# Patient Record
Sex: Female | Born: 1995 | Race: Black or African American | Hispanic: No | Marital: Single | Smoking: Never smoker
Health system: Southern US, Community
[De-identification: ages and names within clinical notes are randomized; demographics above are authoritative.]

## PROBLEM LIST (undated history)

## (undated) DIAGNOSIS — F329 Major depressive disorder, single episode, unspecified: Secondary | ICD-10-CM

## (undated) DIAGNOSIS — E039 Hypothyroidism, unspecified: Secondary | ICD-10-CM

## (undated) DIAGNOSIS — R002 Palpitations: Secondary | ICD-10-CM

## (undated) DIAGNOSIS — H409 Unspecified glaucoma: Secondary | ICD-10-CM

## (undated) DIAGNOSIS — F419 Anxiety disorder, unspecified: Secondary | ICD-10-CM

## (undated) DIAGNOSIS — J45909 Unspecified asthma, uncomplicated: Secondary | ICD-10-CM

## (undated) DIAGNOSIS — F32A Depression, unspecified: Secondary | ICD-10-CM

## (undated) DIAGNOSIS — R0789 Other chest pain: Principal | ICD-10-CM

## (undated) DIAGNOSIS — K219 Gastro-esophageal reflux disease without esophagitis: Secondary | ICD-10-CM

## (undated) DIAGNOSIS — E079 Disorder of thyroid, unspecified: Secondary | ICD-10-CM

## (undated) HISTORY — DX: Palpitations: R00.2

## (undated) HISTORY — DX: Other chest pain: R07.89

## (undated) HISTORY — PX: WISDOM TOOTH EXTRACTION: SHX21

## (undated) HISTORY — PX: EYE SURGERY: SHX253

---
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
## 2016-01-13 ENCOUNTER — Other Ambulatory Visit (HOSPITAL_COMMUNITY)
Admission: RE | Admit: 2016-01-13 | Discharge: 2016-01-13 | Disposition: A | Discharge status: Home / Self Care | Payer: BLUE CROSS/BLUE SHIELD | Source: Ambulatory Visit | Attending: Family Medicine | Admitting: Family Medicine

## 2016-01-13 ENCOUNTER — Encounter (HOSPITAL_COMMUNITY): Payer: Self-pay | Admitting: Emergency Medicine

## 2016-01-13 ENCOUNTER — Emergency Department (INDEPENDENT_AMBULATORY_CARE_PROVIDER_SITE_OTHER)
Admission: EM | Admit: 2016-01-13 | Discharge: 2016-01-13 | Disposition: A | Discharge status: Home / Self Care | Source: Home / Self Care | Attending: Family Medicine | Admitting: Family Medicine

## 2016-01-13 DIAGNOSIS — N949 Unspecified condition associated with female genital organs and menstrual cycle: Secondary | ICD-10-CM

## 2016-01-13 DIAGNOSIS — Z113 Encounter for screening for infections with a predominantly sexual mode of transmission: Secondary | ICD-10-CM | POA: Diagnosis present

## 2016-01-13 DIAGNOSIS — N760 Acute vaginitis: Secondary | ICD-10-CM | POA: Insufficient documentation

## 2016-01-13 DIAGNOSIS — R1032 Left lower quadrant pain: Secondary | ICD-10-CM | POA: Diagnosis not present

## 2016-01-13 HISTORY — DX: Unspecified glaucoma: H40.9

## 2016-01-13 LAB — POCT URINALYSIS DIP (DEVICE)
Glucose, UA: NEGATIVE mg/dL
Hgb urine dipstick: NEGATIVE
LEUKOCYTES UA: NEGATIVE
NITRITE: NEGATIVE
PROTEIN: 30 mg/dL — AB
SPECIFIC GRAVITY, URINE: 1.025 (ref 1.005–1.030)
UROBILINOGEN UA: 2 mg/dL — AB (ref 0.0–1.0)
pH: 6.5 (ref 5.0–8.0)

## 2016-01-13 LAB — URINALYSIS, ROUTINE W REFLEX MICROSCOPIC
Glucose, UA: NEGATIVE mg/dL
Hgb urine dipstick: NEGATIVE
Ketones, ur: 15 mg/dL — AB
Leukocytes, UA: NEGATIVE
NITRITE: NEGATIVE
PH: 6 (ref 5.0–8.0)
PROTEIN: NEGATIVE mg/dL
Specific Gravity, Urine: 1.029 (ref 1.005–1.030)

## 2016-01-13 LAB — URINE MICROSCOPIC-ADD ON: RBC / HPF: NONE SEEN RBC/hpf (ref 0–5)

## 2016-01-13 LAB — POCT PREGNANCY, URINE: Preg Test, Ur: NEGATIVE

## 2016-01-13 MED ORDER — CEFTRIAXONE SODIUM 250 MG IJ SOLR
250.0000 mg | Freq: Once | INTRAMUSCULAR | Status: AC
  Administered 2016-01-13: 250 mg via INTRAMUSCULAR

## 2016-01-13 MED ORDER — CEFTRIAXONE SODIUM 250 MG IJ SOLR
INTRAMUSCULAR | Status: AC
  Filled 2016-01-13: qty 250

## 2016-01-13 MED ORDER — LIDOCAINE HCL (PF) 1 % IJ SOLN
INTRAMUSCULAR | Status: AC
  Filled 2016-01-13: qty 5

## 2016-01-13 MED ORDER — NAPROXEN 500 MG PO TABS: 500.0000 mg | ORAL_TABLET | Freq: Two times a day (BID) | ORAL | Status: DC

## 2016-01-13 MED ORDER — AZITHROMYCIN 250 MG PO TABS
ORAL_TABLET | ORAL | Status: AC
  Filled 2016-01-13: qty 4

## 2016-01-13 MED ORDER — AZITHROMYCIN 250 MG PO TABS
1000.0000 mg | ORAL_TABLET | Freq: Once | ORAL | Status: AC
  Administered 2016-01-13: 1000 mg via ORAL

## 2016-01-13 NOTE — ED Notes (Signed)
Complains of a week of symptoms: dizziness, left lower abdominal pain and chest pain.  [facility]  Patient says she was tested for std and urine tests and told all were negative.  Patient here today because she is still having symptoms.  Last bm was this morning and made no change in degree of symptoms.  Left lower abdominal pain is constant and dull.

## 2016-01-13 NOTE — ED Provider Notes (Addendum)
CSN: [id number]     Arrival date & time 01/13/16  1257 History   First MD Initiated Contact with Patient 01/13/16 1313     Chief Complaint  Patient presents with  . Abdominal Pain  . Dizziness   (Consider location/radiation/quality/duration/timing/severity/associated sxs/prior Treatment) Patient is a 20 y.o. female presenting with abdominal pain and dizziness. The history is provided by the patient. No language interpreter was used.  Abdominal Pain Associated symptoms: chest pain, diarrhea, dysuria and nausea   Associated symptoms: no chills, no cough, no fatigue, no fever, no shortness of breath, no vaginal discharge and no vomiting   Dizziness Associated symptoms: chest pain, diarrhea and nausea   Associated symptoms: no blood in stool, no shortness of breath and no vomiting   Patient presents with one week of dull LLQ abd pain, associated with decreased appetite and watery non-bloody stool.  Came on a week ago; mildly improved now from earlier. Last BM this morning, was watery.  Also BM yesterday, watery. No dietary changes, no travel.  Has had this before, usually resolves in 2-3 days. No fevers or chills, no vomiting.    She reports some reflux-like chest pain; some mild dysuria without urgency or frequency.  History cystitis in Summer 2016.  No abdominal surgeries.  LMP March 19th, 2017 and usual timing. Last sexual intercourse was 4 months ago. No STI history.  [facility], says she was tested for STIs and called last night to notify that all the testing was negative.   Past Medical History  Diagnosis Date  . Asthma   . Thyroid disease    No past surgical history on file. No family history on file. Social History  Substance Use Topics  . Smoking status: Never Smoker   . Smokeless tobacco: Not on file  . Alcohol Use: No   OB History    No data available     Review of Systems  Constitutional: Positive for appetite change.  Negative for fever, chills, diaphoresis and fatigue.  Respiratory: Negative for cough and shortness of breath.   Cardiovascular: Positive for chest pain.       Chest pain like reflux in nature  Gastrointestinal: Positive for nausea, abdominal pain and diarrhea. Negative for vomiting, blood in stool, abdominal distention and anal bleeding.  Genitourinary: Positive for dysuria. Negative for decreased urine volume and vaginal discharge.  Neurological: Positive for dizziness.    Allergies  Review of patient's allergies indicates no known allergies.  Home Medications   Prior to Admission medications   Medication Sig Start Date End Date Taking? Authorizing Provider  albuterol (PROVENTIL HFA;VENTOLIN HFA) 108 (90 BASE) MCG/ACT inhaler Inhale 1-2 puffs into the lungs every 6 (six) hours as needed for wheezing or shortness of breath.    Historical Provider, MD  albuterol (PROVENTIL HFA;VENTOLIN HFA) 108 (90 BASE) MCG/ACT inhaler Inhale 1-2 puffs into the lungs every 6 (six) hours as needed for wheezing or shortness of breath. 05/17/15   Elwin MochaBlair Walden, MD  ALPRAZolam Prudy Feeler(XANAX) 0.5 MG tablet Take 1 tablet (0.5 mg total) by mouth 2 (two) times daily as needed for anxiety. 05/17/15   Elwin MochaBlair Walden, MD  bimatoprost (LUMIGAN) 0.03 % ophthalmic solution Place 1 drop into the right eye at bedtime.    Historical Provider, MD  brimonidine (ALPHAGAN) 0.2 % ophthalmic solution Place 1 drop into the right eye 2 (two) times daily.    Historical Provider, MD  dorzolamide-timolol (COSOPT) 22.3-6.8 MG/ML ophthalmic solution Place  1 drop into the right eye 2 (two) times daily.    Historical Provider, MD  levothyroxine (SYNTHROID, LEVOTHROID) 100 MCG tablet Take 100 mcg by mouth daily before breakfast.    Historical Provider, MD  norgestimate-ethinyl estradiol (ORTHO-CYCLEN,SPRINTEC,PREVIFEM) 0.25-35 MG-MCG tablet Take 1 tablet by mouth daily.    Historical Provider, MD   Meds Ordered and Administered this Visit   Medications - No data to display  BP 97/71 mmHg  Pulse 69  Temp(Src) 98 F (36.7 C) (Oral)  Resp 16  SpO2 95% No data found.   Physical Exam  Constitutional: She appears well-developed and well-nourished. No distress.  Neck: Normal range of motion. Neck supple.  Cardiovascular: Normal rate, regular rhythm and normal heart sounds.   Pulmonary/Chest: Effort normal and breath sounds normal. No respiratory distress. She has no wheezes. She has no rales. She exhibits no tenderness.  Abdominal: She exhibits no distension and no mass. There is no tenderness. There is no rebound and no guarding.  LLQ tenderness; no masses or guarding. Mild suprapubic tenderness. No CVA tenderness. Audible bowel sounds.   Genitourinary: Uterus normal. Vaginal discharge found.  Normal vaginal mucosa.  Scant white vaginal discharge noted.  Bimanual exam with some L adnexal tenderness and CMT.  No enlarged adnexae.  Lymphadenopathy:    She has no cervical adenopathy.  Skin: She is not diaphoretic.    ED Course  Procedures (including critical care time)  Labs Review Labs Reviewed  POCT URINALYSIS DIP (DEVICE) - Abnormal; Notable for the following:    Bilirubin Urine SMALL (*)    Ketones, ur TRACE (*)    Protein, ur 30 (*)    Urobilinogen, UA 2.0 (*)    All other components within normal limits  URINALYSIS, ROUTINE W REFLEX MICROSCOPIC (NOT AT [facility])  CERVICOVAGINAL ANCILLARY ONLY    Imaging Review No results found.   Visual Acuity Review  Right Eye Distance:   Left Eye Distance:   Bilateral Distance:    Right Eye Near:   Left Eye Near:    Bilateral Near:         MDM   1. LLQ abdominal pain    LLQ abdominal pain, onset 1 week ago and mildly better.  She has a primary doctor at her home in [place], learning disability here in [place]). Some CMT on bimanual exam, as well as L adnexal tenderness.  UA dipstick does not support cystitis as diagnosis.  To treat with azithro 1g po x1, ceftriaxone   IM x1; await cervical cx and counseled on rationale for treatment.   Naproxen  po q12 hrs as needed for pain.     Barbaraann Barthel, MD 01/13/16 1328  Barbaraann Barthel, MD 01/13/16 1340

## 2016-01-13 NOTE — Discharge Instructions (Signed)
It is a pleasure to see you today.   We collected urine and cervical specimens for culture and microscopy, to look for various infections that can cause discharge and abdominal pain, and decreased appetite.   As we discussed, you are receiving treatment in the urgent care center for a possible vaginal infection.  You are receiving CEFTRIAXONE 250mg  by intramuscular injection; AZITHROMYCIN 1gram by mouth x1.  You will be contacted when the results of these studies comes back.    For now, you are advised not to have sexual intercourse until the results of the studies are back and your complaint is resolved.   You may take NAPROXEN 500mg  tablets, 1 tablet by mouth twice daily with something to eat, for the pain.   Return to the urgent care center or your primary doctor if you experience worsening abdominal pain, fevers/chills, repeated vomiting, or other concerns.

## 2016-01-14 LAB — CERVICOVAGINAL ANCILLARY ONLY
Chlamydia: NEGATIVE
NEISSERIA GONORRHEA: NEGATIVE
WET PREP (BD AFFIRM): POSITIVE — AB

## 2016-01-15 ENCOUNTER — Telehealth: Payer: Self-pay | Admitting: Internal Medicine

## 2016-01-15 DIAGNOSIS — B9689 Other specified bacterial agents as the cause of diseases classified elsewhere: Secondary | ICD-10-CM

## 2016-01-15 DIAGNOSIS — N760 Acute vaginitis: Principal | ICD-10-CM

## 2016-01-15 LAB — URINE CULTURE

## 2016-01-15 MED ORDER — METRONIDAZOLE 500 MG PO TABS
500.0000 mg | ORAL_TABLET | Freq: Two times a day (BID) | ORAL | Status: DC
Start: 2016-01-15 — End: 2016-08-13

## 2016-01-15 NOTE — ED Notes (Signed)
Test for gardnerella (bacterial vaginosis) positive; tests for gonorrhea/chlamydia negative.  Urine culture pending.  Will send rx for metronidazole to pharmacy of record ([facility]).  Recheck or followup pcp in [place] for persistent dysuria/vag discharge/abd discomfort.  LM  Eustace MooreLaura W Spencer Peterkin, MD 01/15/16 [phone]

## 2016-01-22 ENCOUNTER — Telehealth (HOSPITAL_COMMUNITY): Payer: Self-pay | Admitting: Emergency Medicine

## 2016-01-22 NOTE — ED Notes (Signed)
Called pt and notified of recent lab results from visit 3/28 Pt ID'd properly... Reports feeling better and sx have subsided; still taking Flagyl  Per Dr. Dayton ScrapeMurray,  Clinical staff, please let patient know that test for gardnerella (bacterial vaginosis) was positive; tests for gonorrhea/chlamydia negative. Urine culture pending.  Will send rx for metronidazole to pharmacy of record ([facility]). Recheck or followup pcp in [place] for persistent dysuria/vag discharge/abd discomfort. LM  Adv pt if sx are not getting better to return  Pt verb understanding Education on safe sex given

## 2016-08-13 ENCOUNTER — Ambulatory Visit (HOSPITAL_COMMUNITY)
Admission: EM | Admit: 2016-08-13 | Discharge: 2016-08-13 | Disposition: A | Discharge status: Home / Self Care | Payer: BLUE CROSS/BLUE SHIELD | Attending: Internal Medicine | Admitting: Internal Medicine

## 2016-08-13 ENCOUNTER — Ambulatory Visit (INDEPENDENT_AMBULATORY_CARE_PROVIDER_SITE_OTHER): Payer: BLUE CROSS/BLUE SHIELD

## 2016-08-13 ENCOUNTER — Encounter (HOSPITAL_COMMUNITY): Payer: Self-pay | Admitting: Family Medicine

## 2016-08-13 DIAGNOSIS — R634 Abnormal weight loss: Secondary | ICD-10-CM

## 2016-08-13 DIAGNOSIS — N939 Abnormal uterine and vaginal bleeding, unspecified: Secondary | ICD-10-CM

## 2016-08-13 NOTE — ED Provider Notes (Signed)
CSN: [id number]     Arrival date & time 08/13/16  1123 History   First MD Initiated Contact with Patient 08/13/16 1232     Chief Complaint  Patient presents with  . Constipation  . Vaginal Bleeding  . Weight Loss   (Consider location/radiation/quality/duration/timing/severity/associated sxs/prior Treatment) HPI NP 20 Y/O FEMALE [employer]. WEIGHT LOSS SINCE SCHOOL STARTED 25 #. ALSO WITH BREAKTHROUGH VAGINAL BLEEDING. AND NOW CONSTIPATION. STATES SHE IS BEING TREATED FOR DEPRESSION WITH MEDS AND COUNSELOR. SHE DOES HAVE CONSTIPATION, NO BM SINCE Saturday STARTED LEXAPRO RECENTLY AND IS FEELING MUCH BETTER.  Past Medical History:  Diagnosis Date  . Asthma   . Glaucoma   . Thyroid disease    Past Surgical History:  Procedure Laterality Date  . EYE SURGERY     History reviewed. No pertinent family history. Social History  Substance Use Topics  . Smoking status: Never Smoker  . Smokeless tobacco: Never Used  . Alcohol use No   OB History    No data available     Review of Systems  Denies: HEADACHE, NAUSEA, ABDOMINAL PAIN, CHEST PAIN, CONGESTION, DYSURIA, SHORTNESS OF BREATH  Allergies  Review of patient's allergies indicates no known allergies.  Home Medications   Prior to Admission medications   Medication Sig Start Date End Date Taking? Authorizing Provider  escitalopram (LEXAPRO) 10 MG tablet Take 10 mg by mouth daily.   Yes Historical Provider, MD  albuterol (PROVENTIL HFA;VENTOLIN HFA) 108 (90 BASE) MCG/ACT inhaler Inhale 1-2 puffs into the lungs every 6 (six) hours as needed for wheezing or shortness of breath.    Historical Provider, MD  albuterol (PROVENTIL HFA;VENTOLIN HFA) 108 (90 BASE) MCG/ACT inhaler Inhale 1-2 puffs into the lungs every 6 (six) hours as needed for wheezing or shortness of breath. 05/17/15   Elwin Mocha, MD  levothyroxine (SYNTHROID, LEVOTHROID) 100 MCG tablet Take 100 mcg by mouth daily before breakfast.    Historical  Provider, MD   Meds Ordered and Administered this Visit  Medications - No data to display  BP 131/68   Pulse 67   Temp 98.2 F (36.8 C)   Resp 18   LMP 08/09/2016   SpO2 100%  No data found.   Physical Exam NURSES NOTES AND VITAL SIGNS REVIEWED. CONSTITUTIONAL: Well developed, well nourished, no acute distress HEENT: normocephalic, atraumatic EYES: Conjunctiva normal NECK:normal ROM, supple, no adenopathy PULMONARY:No respiratory distress, normal effort ABDOMINAL: Soft, ND, NT BS+, No CVAT MUSCULOSKELETAL: Normal ROM of all extremities,  SKIN: warm and dry without rash PSYCHIATRIC: Mood and affect, behavior are normal  Urgent Care Course   Clinical Course    Procedures (including critical care time)  Labs Review Labs Reviewed - No data to display  Imaging Review Dg Abd 1 View  Result Date: 08/13/2016 CLINICAL DATA:  Constipation. EXAM: ABDOMEN - 1 VIEW COMPARISON:  None. FINDINGS: The bowel gas pattern is normal. No radio-opaque calculi or other significant radiographic abnormality are seen. IMPRESSION: Negative. Electronically Signed   By: Elsie Stain M.D.   On: 08/13/2016 13:16     Visual Acuity Review  Right Eye Distance:   Left Eye Distance:   Bilateral Distance:    Right Eye Near:   Left Eye Near:    Bilateral Near:         MDM   1. Vaginal bleeding   2. Weight loss     Patient is reassured that there are no issues that require transfer to higher level  of care at this time or additional tests. Patient is advised to continue home symptomatic treatment. Patient is advised that if there are new or worsening symptoms to attend the emergency department, contact primary care provider, or return to UC. Instructions of care provided discharged home in stable condition.    THIS NOTE WAS GENERATED USING A VOICE RECOGNITION SOFTWARE PROGRAM. ALL REASONABLE EFFORTS  WERE MADE TO PROOFREAD THIS DOCUMENT FOR ACCURACY.  I have verbally reviewed the  discharge instructions with the patient. A printed AVS was given to the patient.  All questions were answered prior to discharge.      Tharon AquasFrank C Devina Bezold, PA 08/13/16 1334

## 2016-08-13 NOTE — Discharge Instructions (Signed)
SOMETIMES DEPRESSION CAN CAUSE DECREASED INTEREST IN FOODS, THAT CAN CAUSE WEIGHT LOSS  YOUR XRAY APPEARS NORMAL  YOU SHOULD FOLLOW UP WITH GYN ABOUT YOUR BREAKTHROUGH BLEEDING.

## 2016-08-13 NOTE — ED Triage Notes (Signed)
Pt here for weight loss x 3 weeks, left pelvic pain, constipation and abnormal vaginal bleeding. sts that she has the implant her arm and typically has normal periods. sts LBM last Saturday.

## 2016-08-14 ENCOUNTER — Emergency Department (HOSPITAL_COMMUNITY)
Admission: EM | Admit: 2016-08-14 | Discharge: 2016-08-14 | Disposition: A | Discharge status: Home / Self Care | Payer: BLUE CROSS/BLUE SHIELD | Attending: Emergency Medicine | Admitting: Emergency Medicine

## 2016-08-14 ENCOUNTER — Encounter (HOSPITAL_COMMUNITY): Payer: Self-pay | Admitting: *Deleted

## 2016-08-14 ENCOUNTER — Emergency Department (HOSPITAL_COMMUNITY): Payer: BLUE CROSS/BLUE SHIELD

## 2016-08-14 DIAGNOSIS — R1030 Lower abdominal pain, unspecified: Secondary | ICD-10-CM | POA: Insufficient documentation

## 2016-08-14 DIAGNOSIS — Z91010 Allergy to peanuts: Secondary | ICD-10-CM | POA: Insufficient documentation

## 2016-08-14 DIAGNOSIS — R0789 Other chest pain: Secondary | ICD-10-CM

## 2016-08-14 DIAGNOSIS — J45909 Unspecified asthma, uncomplicated: Secondary | ICD-10-CM | POA: Diagnosis not present

## 2016-08-14 DIAGNOSIS — R0602 Shortness of breath: Secondary | ICD-10-CM | POA: Diagnosis present

## 2016-08-14 HISTORY — DX: Anxiety disorder, unspecified: F41.9

## 2016-08-14 HISTORY — DX: Depression, unspecified: F32.A

## 2016-08-14 HISTORY — DX: Major depressive disorder, single episode, unspecified: F32.9

## 2016-08-14 LAB — URINALYSIS, ROUTINE W REFLEX MICROSCOPIC
Bilirubin Urine: NEGATIVE
GLUCOSE, UA: NEGATIVE mg/dL
KETONES UR: 40 mg/dL — AB
NITRITE: NEGATIVE
PROTEIN: NEGATIVE mg/dL
Specific Gravity, Urine: 1.02 (ref 1.005–1.030)
pH: 7.5 (ref 5.0–8.0)

## 2016-08-14 LAB — CBC
HEMATOCRIT: 41.2 % (ref 36.0–46.0)
HEMOGLOBIN: 14.4 g/dL (ref 12.0–15.0)
MCH: 31.4 pg (ref 26.0–34.0)
MCHC: 35 g/dL (ref 30.0–36.0)
MCV: 89.8 fL (ref 78.0–100.0)
Platelets: 218 10*3/uL (ref 150–400)
RBC: 4.59 MIL/uL (ref 3.87–5.11)
RDW: 12.1 % (ref 11.5–15.5)
WBC: 4.5 10*3/uL (ref 4.0–10.5)

## 2016-08-14 LAB — BASIC METABOLIC PANEL
Anion gap: 10 (ref 5–15)
BUN: 8 mg/dL (ref 6–20)
CHLORIDE: 108 mmol/L (ref 101–111)
CO2: 21 mmol/L — AB (ref 22–32)
Calcium: 9.9 mg/dL (ref 8.9–10.3)
Creatinine, Ser: 1.01 mg/dL — ABNORMAL HIGH (ref 0.44–1.00)
GFR calc Af Amer: >60 mL/min (ref >60)
Glucose, Bld: 94 mg/dL (ref 65–99)
Potassium: 3.4 mmol/L — ABNORMAL LOW (ref 3.5–5.1)
SODIUM: 139 mmol/L (ref 135–145)

## 2016-08-14 LAB — URINE MICROSCOPIC-ADD ON

## 2016-08-14 LAB — I-STAT TROPONIN, ED: Troponin i, poc: 0 ng/mL (ref 0.00–0.08)

## 2016-08-14 LAB — POC URINE PREG, ED: PREG TEST UR: NEGATIVE

## 2016-08-14 NOTE — ED Triage Notes (Signed)
Patient reports [facility] was [facility] on yesterday.  She then went to her school health office.  Patient dx with UTI.  She was also dx with folicullitis.  Patient states she has taken 2 doses of cipro.  Patient reports a couple of hours ago she developed chest pain and sob.  She admits that it may be her anxiety.   Patient denies any stressors this week but admits to increased anxiety last week.  She is alert.  Skin warm and dry.  Patient is in school at [employer]

## 2016-08-14 NOTE — ED Notes (Signed)
Walked patient to the bathroom  

## 2016-08-14 NOTE — ED Provider Notes (Signed)
[facility]-EMERGENCY DEPT Provider Note   CSN: [id number] Arrival date & time: 08/14/16  1409     History   Chief Complaint Chief Complaint  Patient presents with  . Shortness of Breath  . Chest Pain    HPI Ashlee Peterson is a 20 y.o. female.   Chest Pain   This is a new problem. The current episode started 12 to 24 hours ago. The problem occurs hourly. Progression since onset: waxing and waning. Associated with: certain positions, laying on left shoulder. The pain is present in the lateral region (left). The pain is moderate. The quality of the pain is described as brief. The pain does not radiate. Duration of episode(s) is 2 minutes. The symptoms are aggravated by certain positions. Associated symptoms include abdominal pain (lower, being treated for UTI). Pertinent negatives include no back pain. She has tried nothing for the symptoms. The treatment provided no relief. There are no known risk factors.    Past Medical History:  Diagnosis Date  . Anxiety   . Asthma   . Depression   . Glaucoma   . Thyroid disease     There are no active problems to display for this patient.   Past Surgical History:  Procedure Laterality Date  . EYE SURGERY      OB History    No data available       Home Medications    Prior to Admission medications   Medication Sig Start Date End Date Taking? Authorizing Provider  bimatoprost (LUMIGAN) 0.03 % ophthalmic solution Place 1 drop into the right eye at bedtime.   Yes Historical Provider, MD  brimonidine (ALPHAGAN) 0.15 % ophthalmic solution Place 1 drop into the right eye daily.   Yes Historical Provider, MD  ciprofloxacin (CIPRO) 250 MG tablet Take 250 mg by mouth 2 (two) times daily.   Yes Historical Provider, MD  dorzolamide-timolol (COSOPT) 22.3-6.8 MG/ML ophthalmic solution Place 1 drop into the right eye daily.   Yes Historical Provider, MD  escitalopram (LEXAPRO) 10 MG tablet Take 10 mg by mouth daily.   Yes Historical  Provider, MD  levothyroxine (SYNTHROID, LEVOTHROID) 100 MCG tablet Take 100 mcg by mouth daily before breakfast.   Yes Historical Provider, MD  albuterol (PROVENTIL HFA;VENTOLIN HFA) 108 (90 BASE) MCG/ACT inhaler Inhale 1-2 puffs into the lungs every 6 (six) hours as needed for wheezing or shortness of breath.    Historical Provider, MD  albuterol (PROVENTIL HFA;VENTOLIN HFA) 108 (90 BASE) MCG/ACT inhaler Inhale 1-2 puffs into the lungs every 6 (six) hours as needed for wheezing or shortness of breath. 05/17/15   Elwin MochaBlair Walden, MD    Family History No family history on file.  Social History Social History  Substance Use Topics  . Smoking status: Never Smoker  . Smokeless tobacco: Never Used  . Alcohol use No     Allergies   Peanut-containing drug products   Review of Systems Review of Systems  Cardiovascular: Positive for chest pain.  Gastrointestinal: Positive for abdominal pain (lower, being treated for UTI).  Musculoskeletal: Negative for back pain.     Physical Exam Updated Vital Signs BP 142/71 (BP Location: Right Arm)   Pulse 113   Temp 99.1 F (37.3 C) (Oral)   Resp 24   LMP 08/09/2016   SpO2 100%   Physical Exam  Constitutional: She is oriented to person, place, and time. She appears well-developed and well-nourished. No distress.  HENT:  Head: Normocephalic.  Nose: Nose normal.  Eyes: Conjunctivae  are normal.  Neck: Neck supple. No tracheal deviation present.  Cardiovascular: Normal rate, regular rhythm and normal heart sounds.   Pulmonary/Chest: Effort normal and breath sounds normal. No respiratory distress. She has no wheezes. She exhibits no tenderness.  Abdominal: Soft. She exhibits no distension. There is no tenderness. There is no rebound and no guarding.  Neurological: She is alert and oriented to person, place, and time.  Skin: Skin is warm and dry.  Psychiatric: She has a normal mood and affect.     ED Treatments / Results  Labs (all labs  ordered are listed, but only abnormal results are displayed) Labs Reviewed  BASIC METABOLIC PANEL - Abnormal; Notable for the following:       Result Value   Potassium 3.4 (*)    CO2 21 (*)    Creatinine, Ser 1.01 (*)    All other components within normal limits  URINALYSIS, ROUTINE W REFLEX MICROSCOPIC (NOT AT [facility]) - Abnormal; Notable for the following:    Color, Urine Elenie (*)    APPearance CLOUDY (*)    Hgb urine dipstick LARGE (*)    Ketones, ur 40 (*)    Leukocytes, UA LARGE (*)    All other components within normal limits  URINE MICROSCOPIC-ADD ON - Abnormal; Notable for the following:    Squamous Epithelial / LPF 0-5 (*)    Bacteria, UA FEW (*)    All other components within normal limits  URINE CULTURE  CBC  I-STAT TROPOININ, ED  POC URINE PREG, ED    EKG  EKG Interpretation  Date/Time:  Saturday August 14 2016 14:37:06 EDT Ventricular Rate:  76 PR Interval:  116 QRS Duration: 92 QT Interval:  392 QTC Calculation: 441 R Axis:   79 Text Interpretation:  Normal sinus rhythm Nonspecific T wave abnormality Abnormal ECG no significant change since July 2016 Confirmed by Criss AlvineGOLDSTON MD, SCOTT [phone]([id number]) on 08/14/2016 2:46:31 PM       Radiology Dg Chest 2 View  Result Date: 08/14/2016 CLINICAL DATA:  Chest pain.  Shortness of breath. EXAM: CHEST  2 VIEW COMPARISON:  None. FINDINGS: The heart size and mediastinal contours are within normal limits. Both lungs are clear. No pneumothorax or pleural effusion is noted. The visualized skeletal structures are unremarkable. IMPRESSION: No active cardiopulmonary disease. Electronically Signed   By: Lupita RaiderJames  Green Jr, M.D.   On: 08/14/2016 15:29   Dg Abd 1 View  Result Date: 08/13/2016 CLINICAL DATA:  Constipation. EXAM: ABDOMEN - 1 VIEW COMPARISON:  None. FINDINGS: The bowel gas pattern is normal. No radio-opaque calculi or other significant radiographic abnormality are seen. IMPRESSION: Negative. Electronically Signed   By: Elsie StainJohn T  Curnes M.D.   On: 08/13/2016 13:16    Procedures Procedures (including critical care time)  Medications Ordered in ED Medications - No data to display   Initial Impression / Assessment and Plan / ED Course  I have reviewed the triage vital signs and the nursing notes.  Pertinent labs & imaging results that were available during my care of the patient were reviewed by me and considered in my medical decision making (see chart for details).  Clinical Course    20 y.o. female presents with sharp left sided chest pain. Highly atypical for ACS and low risk with normal EKG and negative troponin. The patient is PERC negative. Being treated with cipro for UTI noted on outpatient evaluation. Started last night. Will send culture to ensure appropriate therapy pending sensitivities. Plan to follow up with  PCP as needed and return precautions discussed for worsening or new concerning symptoms.   Final Clinical Impressions(s) / ED Diagnoses   Final diagnoses:  Atypical chest pain    New Prescriptions New Prescriptions   No medications on file     Lyndal Pulley, MD 08/15/16 0021

## 2016-08-15 LAB — URINE CULTURE

## 2016-08-16 ENCOUNTER — Inpatient Hospital Stay (HOSPITAL_COMMUNITY)
Admission: AD | Admit: 2016-08-16 | Discharge: 2016-08-16 | Disposition: A | Discharge status: Home / Self Care | Payer: BLUE CROSS/BLUE SHIELD | Source: Ambulatory Visit | Attending: Family Medicine | Admitting: Family Medicine

## 2016-08-16 ENCOUNTER — Encounter (HOSPITAL_COMMUNITY): Payer: Self-pay | Admitting: *Deleted

## 2016-08-16 DIAGNOSIS — N939 Abnormal uterine and vaginal bleeding, unspecified: Secondary | ICD-10-CM | POA: Diagnosis present

## 2016-08-16 DIAGNOSIS — A5901 Trichomonal vulvovaginitis: Secondary | ICD-10-CM | POA: Insufficient documentation

## 2016-08-16 DIAGNOSIS — R1010 Upper abdominal pain, unspecified: Secondary | ICD-10-CM | POA: Insufficient documentation

## 2016-08-16 DIAGNOSIS — A599 Trichomoniasis, unspecified: Secondary | ICD-10-CM

## 2016-08-16 HISTORY — DX: Hypothyroidism, unspecified: E03.9

## 2016-08-16 LAB — URINALYSIS, ROUTINE W REFLEX MICROSCOPIC
Glucose, UA: 100 mg/dL — AB
Ketones, ur: >80 mg/dL — AB
NITRITE: POSITIVE — AB
PH: 5.5 (ref 5.0–8.0)
Protein, ur: 100 mg/dL — AB

## 2016-08-16 LAB — URINE MICROSCOPIC-ADD ON

## 2016-08-16 LAB — CBC
HEMATOCRIT: 40.4 % (ref 36.0–46.0)
HEMOGLOBIN: 14.2 g/dL (ref 12.0–15.0)
MCH: 31.3 pg (ref 26.0–34.0)
MCHC: 35.1 g/dL (ref 30.0–36.0)
MCV: 89.2 fL (ref 78.0–100.0)
Platelets: 213 10*3/uL (ref 150–400)
RBC: 4.53 MIL/uL (ref 3.87–5.11)
RDW: 12.2 % (ref 11.5–15.5)
WBC: 6.5 10*3/uL (ref 4.0–10.5)

## 2016-08-16 LAB — WET PREP, GENITAL
CLUE CELLS WET PREP: NONE SEEN
SPERM: NONE SEEN
Yeast Wet Prep HPF POC: NONE SEEN

## 2016-08-16 MED ORDER — PANTOPRAZOLE SODIUM 40 MG PO TBEC: 40.0000 mg | DELAYED_RELEASE_TABLET | Freq: Every day | ORAL | 2 refills | Status: DC

## 2016-08-16 MED ORDER — IBUPROFEN 600 MG PO TABS
600.0000 mg | ORAL_TABLET | Freq: Once | ORAL | Status: AC
  Administered 2016-08-16: 600 mg via ORAL
  Filled 2016-08-16: qty 1

## 2016-08-16 MED ORDER — DEXTROSE 5 % IN LACTATED RINGERS IV BOLUS: 1000.0000 mL | Freq: Once | INTRAVENOUS | Status: DC

## 2016-08-16 MED ORDER — METRONIDAZOLE 500 MG PO TABS
2000.0000 mg | ORAL_TABLET | Freq: Once | ORAL | Status: AC
  Administered 2016-08-16: 2000 mg via ORAL
  Filled 2016-08-16: qty 4

## 2016-08-16 MED ORDER — DEXTROSE 5 % AND 0.9 % NACL IV BOLUS: 1000.0000 mL | Freq: Once | INTRAVENOUS | Status: DC

## 2016-08-16 MED ORDER — DEXTROSE 5 % AND 0.9 % NACL IV BOLUS
1000.0000 mL | Freq: Once | INTRAVENOUS | Status: AC
  Administered 2016-08-16: 1000 mL via INTRAVENOUS

## 2016-08-16 MED ORDER — IBUPROFEN 600 MG PO TABS: 600.0000 mg | ORAL_TABLET | Freq: Four times a day (QID) | ORAL | 0 refills | Status: DC | PRN

## 2016-08-16 MED ORDER — FAMOTIDINE IN NACL 20-0.9 MG/50ML-% IV SOLN
20.0000 mg | Freq: Once | INTRAVENOUS | Status: AC
  Administered 2016-08-16: 20 mg via INTRAVENOUS
  Filled 2016-08-16: qty 50

## 2016-08-16 NOTE — Discharge Instructions (Signed)
Trichomoniasis Trichomoniasis is an infection caused by an organism called Trichomonas. The infection can affect both women and men. In women, the outer female genitalia and the vagina are affected. In men, the penis is mainly affected, but the prostate and other reproductive organs can also be involved. Trichomoniasis is a sexually transmitted infection (STI) and is most often passed to another person through sexual contact.  RISK FACTORS  Having unprotected sexual intercourse.  Having sexual intercourse with an infected partner. SIGNS AND SYMPTOMS  Symptoms of trichomoniasis in women include:  Abnormal gray-green frothy vaginal discharge.  Itching and irritation of the vagina.  Itching and irritation of the area outside the vagina. Symptoms of trichomoniasis in men include:   Penile discharge with or without pain.  Pain during urination. This results from inflammation of the urethra. DIAGNOSIS   Trichomoniasis may be found during a Pap test or physical exam. Your health care provider may use one of the following methods to help diagnose this infection:  Testing the pH of the vagina with a test tape.  Using a vaginal swab test that checks for the Trichomonas organism. A test is available that provides results within a few minutes.  Examining a urine sample.  Testing vaginal secretions. Your health care provider may test you for other STIs, including HIV. TREATMENT   You may be given medicine to fight the infection. Women should inform their health care provider if they could be or are pregnant. Some medicines used to treat the infection should not be taken during pregnancy.  Your health care provider may recommend over-the-counter medicines or creams to decrease itching or irritation.  Your sexual partner will need to be treated if infected.  Your health care provider may test you for infection again 3 months after treatment. HOME CARE INSTRUCTIONS   Take medicines only as  directed by your health care provider.  Take over-the-counter medicine for itching or irritation as directed by your health care provider.  Do not have sexual intercourse while you have the infection.  Women should not douche or wear tampons while they have the infection.  Discuss your infection with your partner. Your partner may have gotten the infection from you, or you may have gotten it from your partner.  Have your sex partner get examined and treated if necessary.  Practice safe, informed, and protected sex.  See your health care provider for other STI testing. SEEK MEDICAL CARE IF:   You still have symptoms after you finish your medicine.  You develop abdominal pain.  You have pain when you urinate.  You have bleeding after sexual intercourse.  You develop a rash.  Your medicine makes you sick or makes you throw up (vomit). MAKE SURE YOU:  Understand these instructions.  Will watch your condition.  Will get help right away if you are not doing well or get worse.   This information is not intended to replace advice given to you by your health care provider. Make sure you discuss any questions you have with your health care provider.   Document Released: 03/30/2001 Document Revised: 10/25/2014 Document Reviewed: 07/16/2013 Elsevier Interactive Patient Education 2016 [employer] Food Choices for Gastroesophageal Reflux Disease, Adult When you have gastroesophageal reflux disease (GERD), the foods you eat and your eating habits are very important. Choosing the right foods can help ease the discomfort of GERD. WHAT GENERAL GUIDELINES DO I NEED TO FOLLOW?  Choose fruits, vegetables, whole grains, low-fat dairy products, and low-fat meat, fish, and poultry.  Limit fats such as oils, salad dressings, butter, nuts, and avocado.  Keep a food diary to identify foods that cause symptoms.  Avoid foods that cause reflux. These may be different for different  people.  Eat frequent small meals instead of three large meals each day.  Eat your meals slowly, in a relaxed setting.  Limit fried foods.  Cook foods using methods other than frying.  Avoid drinking alcohol.  Avoid drinking large amounts of liquids with your meals.  Avoid bending over or lying down until 2-3 hours after eating. WHAT FOODS ARE NOT RECOMMENDED? The following are some foods and drinks that may worsen your symptoms: Vegetables Tomatoes. Tomato juice. Tomato and spaghetti sauce. Chili peppers. Onion and garlic. Horseradish. Fruits Oranges, grapefruit, and lemon (fruit and juice). Meats High-fat meats, fish, and poultry. This includes hot dogs, ribs, ham, sausage, salami, and bacon. Dairy Whole milk and chocolate milk. Sour cream. Cream. Butter. Ice cream. Cream cheese.  Beverages Coffee and tea, with or without caffeine. Carbonated beverages or energy drinks. Condiments Hot sauce. Barbecue sauce.  Sweets/Desserts Chocolate and cocoa. Donuts. Peppermint and spearmint. Fats and Oils High-fat foods, including [place] fries and potato chips. Other Vinegar. Strong spices, such as black pepper, white pepper, red pepper, cayenne, curry powder, cloves, ginger, and chili powder. The items listed above may not be a complete list of foods and beverages to avoid. Contact your dietitian for more information.   This information is not intended to replace advice given to you by your health care provider. Make sure you discuss any questions you have with your health care provider.   Document Released: 10/04/2005 Document Revised: 10/25/2014 Document Reviewed: 08/08/2013 Elsevier Interactive Patient Education 2016 [employer].           Expedited Partner Therapy:  Information Sheet for Patients and Partners               You have been offered expedited partner therapy (EPT). This information sheet contains important information and warnings you need to be aware of,  so please read it carefully.   Expedited Partner Therapy (EPT) is the clinical practice of treating the sexual partners of persons who receive chlamydia, gonorrhea, or trichomoniasis diagnoses by providing medications or prescriptions to the patient. Patients then provide partners with these therapies without the health-care provider having examined the partner. In other words, EPT is a convenient, fast and private way for patients to help their sexual partners get treated.   Chlamydia and gonorrhea are bacterial infections you get from having sex with a person who is already infected. Trichomoniasis (or trich) is a very common sexually transmitted infection (STI) that is caused by infection with a protozoan parasite called Trichomonas vaginalis.  Many people with these infections dont know it because they feel fine, but without treatment these infections can cause serious health problems, such as pelvic inflammatory disease, ectopic pregnancy, infertility and increased risk of HIV.   It is important to get treated as soon as possible to protect your health, to avoid spreading these infections to others, and to prevent yourself from becoming re-infected. The good news is these infections can be easily cured with proper antibiotic medicine. The best way to take care of your self is to see a doctor or go to your local health department. If you are not able to see a doctor or other medical provider, you should take EPT.    Recommended Medication: EPT for Chlamydia:  Azithromycin (Zithromax) 1 gram orally in a  single dose EPT for Gonorrhea:  Cefixime (Suprax) 400 milligrams orally in a single dose PLUS azithromycin (Zithromax) 1 gram orally in a single dose EPT for Trichomoniasis:  Metronidazole (Flagyl) 2 grams orally in a single dose   These medicines are very safe. However, you should not take them if you have ever had an allergic reaction (like a rash) to any of these medicines: azithromycin  (Zithromax), erythromycin, clarithromycin (Biaxin), metronidazole (Flagyl), tinidazole (Tindimax). If you are uncertain about whether you have an allergy, call your medical provider or pharmacist before taking this medicine. If you have a serious, long-term illness like kidney, liver or heart disease, colitis or stomach problems, or you are currently taking other prescription medication, talk to your provider before taking this medication.   Women: If you have lower belly pain, pain during sex, vomiting, or a fever, do not take this medicine. Instead, you should see a medical provider to be certain you do not have pelvic inflammatory disease (PID). PID can be serious and lead to infertility, pregnancy problems or chronic pelvic pain.   Pregnant Women: It is very important for you to see a doctor to get pregnancy services and pre-natal care. These antibiotics for EPT are safe for pregnant women, but you still need to see a medical provider as soon as possible. It is also important to note that Doxycycline is an alternative therapy for chlamydia, but it should not be taken by someone who is pregnant.   Men: If you have pain or swelling in the testicles or a fever, do not take this medicine and see a medical provider.     Men who have sex with men (MSM): MSM in [place] continue to experience high rates of syphilis and HIV. Many MSM with gonorrhea or chlamydia could also have syphilis and/or HIV and not know it. If you are a man who has sex with other men, it is very important that you see a medical provider and are tested for HIV and syphilis. EPT is not recommended for gonorrhea for MSM.  Recommended treatment for gonorrhea for MSM is Rocephin (shot) AND azithromycin due to decreased cure rate.  Please see your medical provider if this is the case.    Along with this information sheet is a prescription for the medicine. If you receive a prescription it will be in your name and will indicate your date  of birth, or it will be in the name of Expedited Partner Therapy.   In either case, you can have the prescription filled at a pharmacy. You will be responsible for the cost of the medicine, unless you have prescription drug coverage. In that case, you could provide your name so the pharmacy could bill your health plan.   Take the medication as directed. Some people will have a mild, upset stomach, which does not last long. AVOID alcohol 24 hours after taking metronidazole (Flagyl) to reduce the possibility of a disulfiram-like reaction (severe vomiting and abdominal pain).  After taking the medicine, do not have sex for 7 days. Do not share this medicine or give it to anyone else. It is important to tell everyone you have had sex with in the last 60 days that they need to go and get tested for sexually transmitted infections.   Ways to prevent these and other sexually transmitted infections (STIs):    Abstain from sex. This is the only sure way to avoid getting an STI.   Use barrier methods, such as condoms, consistently  and correctly.   Limit the number of sexual partners.   Have regular physical exams, including testing for STIs.   For more information about EPT or other issues pertaining to an STI, please contact your medical provider or the [facility] at [phone] or [email or web address]

## 2016-08-16 NOTE — MAU Provider Note (Signed)
History     CSN: [id number]  Arrival date and time: 08/16/16 [id number]   First Provider Initiated Contact with Patient 08/16/16 1036      Chief Complaint  Patient presents with  . Vaginal Bleeding   HPI   Ashlee Peterson is a 20 y.o. female G0P0000 here in MAU with abdominal vaginal bleeding.  On June 20th she had a nexplanon placed; starting in September she noted bleeding in between her periods. Her last normal period was the beginning of September, it lasted 3 days. At the end of September she started bleeding again "and it never really stopped". She is bleeding currently, the bleeding is moderate; she is changing her pad every time she uses the bathroom. She has a history of depression and anxiety and feels these symptoms are well controlled. She does however note a decreased appetite and weight loss over the last month.  Patient denies N/V or a history of alcohol use.   OB History    Gravida Para Term Preterm AB Living   0 0 0 0 0 0   SAB TAB Ectopic Multiple Live Births   0 0 0 0 0      Past Medical History:  Diagnosis Date  . Anxiety   . Asthma   . Depression   . Glaucoma   . Hypothyroidism   . Thyroid disease     Past Surgical History:  Procedure Laterality Date  . EYE SURGERY    . WISDOM TOOTH EXTRACTION      History reviewed. No pertinent family history.  Social History  Substance Use Topics  . Smoking status: Never Smoker  . Smokeless tobacco: Never Used  . Alcohol use No    Allergies:  Allergies  Allergen Reactions  . Peanut-Containing Drug Products Hives    Prescriptions Prior to Admission  Medication Sig Dispense Refill Last Dose  . albuterol (PROVENTIL HFA;VENTOLIN HFA) 108 (90 BASE) MCG/ACT inhaler Inhale 1-2 puffs into the lungs every 6 (six) hours as needed for wheezing or shortness of breath.   unknown  . albuterol (PROVENTIL HFA;VENTOLIN HFA) 108 (90 BASE) MCG/ACT inhaler Inhale 1-2 puffs into the lungs every 6 (six) hours as  needed for wheezing or shortness of breath. 1 Inhaler 2 unknown  . bimatoprost (LUMIGAN) 0.03 % ophthalmic solution Place 1 drop into the right eye at bedtime.   08/13/2016 at Unknown time  . brimonidine (ALPHAGAN) 0.15 % ophthalmic solution Place 1 drop into the right eye daily.   08/14/2016 at Unknown time  . ciprofloxacin (CIPRO) 250 MG tablet Take 250 mg by mouth 2 (two) times daily.   08/14/2016 at Unknown time  . dorzolamide-timolol (COSOPT) 22.3-6.8 MG/ML ophthalmic solution Place 1 drop into the right eye daily.   08/14/2016 at Unknown time  . escitalopram (LEXAPRO) 10 MG tablet Take 10 mg by mouth daily.   08/13/2016 at Unknown time  . levothyroxine (SYNTHROID, LEVOTHROID) 100 MCG tablet Take 100 mcg by mouth daily before breakfast.   Past Month at Unknown time   Results for orders placed or performed during the hospital encounter of 08/16/16 (from the past 24 hour(s))  Urinalysis, Routine w reflex microscopic (not at [facility])     Status: Abnormal   Collection Time: 08/16/16  9:35 AM  Result Value Ref Range   Color, Urine RED (A) YELLOW   APPearance CLOUDY (A) CLEAR   Specific Gravity, Urine >1.030 (H) 1.005 - 1.030   pH 5.5 5.0 - 8.0   Glucose, UA 100 (  A) NEGATIVE mg/dL   Hgb urine dipstick LARGE (A) NEGATIVE   Bilirubin Urine SMALL (A) NEGATIVE   Ketones, ur >80 (A) NEGATIVE mg/dL   Protein, ur 161100 (A) NEGATIVE mg/dL   Nitrite POSITIVE (A) NEGATIVE   Leukocytes, UA SMALL (A) NEGATIVE  Urine microscopic-add on     Status: Abnormal   Collection Time: 08/16/16  9:35 AM  Result Value Ref Range   Squamous Epithelial / LPF 6-30 (A) NONE SEEN   WBC, UA 0-5 0 - 5 WBC/hpf   RBC / HPF TOO NUMEROUS TO COUNT 0 - 5 RBC/hpf   Bacteria, UA FEW (A) NONE SEEN   Urine-Other TRICHOMONAS PRESENT   Wet prep, genital     Status: Abnormal   Collection Time: 08/16/16 10:46 AM  Result Value Ref Range   Yeast Wet Prep HPF POC NONE SEEN NONE SEEN   Trich, Wet Prep PRESENT (A) NONE SEEN   Clue Cells  Wet Prep HPF POC NONE SEEN NONE SEEN   WBC, Wet Prep HPF POC FEW (A) NONE SEEN   Sperm NONE SEEN   CBC     Status: None   Collection Time: 08/16/16 11:11 AM  Result Value Ref Range   WBC 6.5 4.0 - 10.5 K/uL   RBC 4.53 3.87 - 5.11 MIL/uL   Hemoglobin 14.2 12.0 - 15.0 g/dL   HCT 09.640.4 04.536.0 - 40.946.0 %   MCV 89.2 78.0 - 100.0 fL   MCH 31.3 26.0 - 34.0 pg   MCHC 35.1 30.0 - 36.0 g/dL   RDW 81.112.2 91.411.5 - 78.215.5 %   Platelets 213 150 - 400 K/uL    Review of Systems  Constitutional: Negative for chills and fever.  Gastrointestinal: Positive for abdominal pain (Upper-Middle abdomianl pain. Really bad if consumes tomatoes ). Negative for nausea and vomiting.  Neurological: Positive for dizziness. Negative for speech change.   Physical Exam   Blood pressure 119/78, pulse 81, temperature 98.3 F (36.8 C), temperature source Oral, resp. rate 18, weight 215 lb 8 oz (97.8 kg), last menstrual period 08/05/2016.  Physical Exam  Constitutional: She is oriented to person, place, and time. She appears well-developed and well-nourished. No distress.  GI: Soft. Normal appearance. There is tenderness in the epigastric area. There is no rigidity and no guarding.  Genitourinary:  Genitourinary Comments: Vagina - Small amount of dark red blood in the vagina, some odor  Cervix - No contact bleeding, no active bleeding  Bimanual exam: Cervix closed, no CMT  Uterus non tender, normal size Adnexa non tender, no masses bilaterally GC/Chlam, wet prep done Chaperone present for exam.   Musculoskeletal: Normal range of motion.  Neurological: She is alert and oriented to person, place, and time.  Skin: Skin is warm. She is not diaphoretic.  Psychiatric: Her behavior is normal.    MAU Course  Procedures  None   MDM  - UA - Cbc - GC- pending  - HIV -D5NS bolus with Pepcid  -Ibuprofen 600 mg PO  Patient feels significantly better following treatment. Patient tolerating PO fluids and graham crackers.    -Abnormal spotting likely secondary to trichomonas and possible Nexplanon. If continues I recommended patient be seen in the [facility].   Assessment and Plan   A:  1. Trichomonas infection   2. Upper abdominal pain; likely reflux   3. Abnormal vaginal bleeding     P:  Discharge home in stable condition Patient given 2000 mg of Flagyl here in MAU- tolerated this well. Expedited  partner therapy with partner info sheet provided No sex for 7 days once partner is treated Rx: Ibuprofen, Protonix I highly recommended establishing care with PCP  Discussed diet, avoid fast foods, fried foods.    Duane Lope, NP 08/16/2016 3:44 PM

## 2016-08-16 NOTE — MAU Note (Signed)
Is having a lot of vaginal bleeding, [facility], dx with a UTI.  Had implanon place 6/20.  Has been bleeding for over a month, almost every day.

## 2016-08-16 NOTE — MAU Note (Signed)
Urine sent to lab 

## 2016-08-17 LAB — GC/CHLAMYDIA PROBE AMP (~~LOC~~) NOT AT ARMC
CHLAMYDIA, DNA PROBE: POSITIVE — AB
Neisseria Gonorrhea: NEGATIVE

## 2016-08-17 LAB — HIV ANTIBODY (ROUTINE TESTING W REFLEX): HIV SCREEN 4TH GENERATION: NONREACTIVE

## 2016-08-18 ENCOUNTER — Telehealth (HOSPITAL_COMMUNITY): Payer: Self-pay | Admitting: *Deleted

## 2016-08-18 NOTE — Telephone Encounter (Signed)
Notified pt. Of positive Chlamydia result, [facility] on  [address] 1 gm Azithromycin. Advised to abstain for intercourse for 2 weeks and notify partner for treatment. Health dept. [place] [facility] [place]

## 2016-08-20 ENCOUNTER — Other Ambulatory Visit: Payer: Self-pay | Admitting: Advanced Practice Midwife

## 2016-08-20 ENCOUNTER — Telehealth: Payer: Self-pay | Admitting: Advanced Practice Midwife

## 2016-08-20 MED ORDER — AZITHROMYCIN 250 MG PO TABS: 1000.0000 mg | ORAL_TABLET | Freq: Once | ORAL | 0 refills | Status: AC

## 2016-08-20 MED ORDER — AZITHROMYCIN 250 MG PO TABS: 1000.0000 mg | ORAL_TABLET | Freq: Once | ORAL | 0 refills | Status: DC

## 2016-08-20 MED ORDER — ONDANSETRON 4 MG PO TBDP: 4.0000 mg | ORAL_TABLET | Freq: Once | ORAL | 0 refills | Status: DC

## 2016-08-20 MED ORDER — ONDANSETRON 4 MG PO TBDP: 4.0000 mg | ORAL_TABLET | Freq: Once | ORAL | 0 refills | Status: AC

## 2016-08-20 NOTE — Telephone Encounter (Signed)
Patient arrived at MAU. She states that she took medication for chlamydia, and then vomited immediately after taking it. Sent a new rx for azithromycin and zofran to pharmacy. Advised patient to take zofran 30 mins prior to taking azithromycin, and take with food.

## 2016-09-06 ENCOUNTER — Other Ambulatory Visit: Payer: Self-pay | Admitting: Obstetrics and Gynecology

## 2016-09-06 MED ORDER — ONDANSETRON 4 MG PO TBDP: 4.0000 mg | ORAL_TABLET | Freq: Three times a day (TID) | ORAL | 0 refills | Status: DC | PRN

## 2016-09-06 MED ORDER — AZITHROMYCIN 500 MG PO TABS: 1000.0000 mg | ORAL_TABLET | Freq: Once | ORAL | 0 refills | Status: AC

## 2016-09-30 ENCOUNTER — Encounter (HOSPITAL_COMMUNITY): Payer: Self-pay | Admitting: *Deleted

## 2016-09-30 ENCOUNTER — Inpatient Hospital Stay (HOSPITAL_COMMUNITY)
Admission: AD | Admit: 2016-09-30 | Discharge: 2016-09-30 | Disposition: A | Discharge status: Home / Self Care | Payer: BLUE CROSS/BLUE SHIELD | Source: Ambulatory Visit | Attending: Obstetrics and Gynecology | Admitting: Obstetrics and Gynecology

## 2016-09-30 DIAGNOSIS — E039 Hypothyroidism, unspecified: Secondary | ICD-10-CM | POA: Diagnosis not present

## 2016-09-30 DIAGNOSIS — Z91030 Bee allergy status: Secondary | ICD-10-CM | POA: Diagnosis not present

## 2016-09-30 DIAGNOSIS — Z91010 Allergy to peanuts: Secondary | ICD-10-CM | POA: Insufficient documentation

## 2016-09-30 DIAGNOSIS — H409 Unspecified glaucoma: Secondary | ICD-10-CM | POA: Diagnosis not present

## 2016-09-30 DIAGNOSIS — Z975 Presence of (intrauterine) contraceptive device: Secondary | ICD-10-CM

## 2016-09-30 DIAGNOSIS — N925 Other specified irregular menstruation: Secondary | ICD-10-CM | POA: Insufficient documentation

## 2016-09-30 DIAGNOSIS — R109 Unspecified abdominal pain: Secondary | ICD-10-CM | POA: Insufficient documentation

## 2016-09-30 DIAGNOSIS — F419 Anxiety disorder, unspecified: Secondary | ICD-10-CM | POA: Insufficient documentation

## 2016-09-30 DIAGNOSIS — J45909 Unspecified asthma, uncomplicated: Secondary | ICD-10-CM | POA: Insufficient documentation

## 2016-09-30 DIAGNOSIS — Z202 Contact with and (suspected) exposure to infections with a predominantly sexual mode of transmission: Secondary | ICD-10-CM | POA: Diagnosis present

## 2016-09-30 DIAGNOSIS — F329 Major depressive disorder, single episode, unspecified: Secondary | ICD-10-CM | POA: Insufficient documentation

## 2016-09-30 DIAGNOSIS — N921 Excessive and frequent menstruation with irregular cycle: Secondary | ICD-10-CM

## 2016-09-30 DIAGNOSIS — Z3202 Encounter for pregnancy test, result negative: Secondary | ICD-10-CM | POA: Insufficient documentation

## 2016-09-30 DIAGNOSIS — A749 Chlamydial infection, unspecified: Secondary | ICD-10-CM | POA: Insufficient documentation

## 2016-09-30 LAB — CBC
HEMATOCRIT: 34.3 % — AB (ref 36.0–46.0)
Hemoglobin: 11.7 g/dL — ABNORMAL LOW (ref 12.0–15.0)
MCH: 31.5 pg (ref 26.0–34.0)
MCHC: 34.1 g/dL (ref 30.0–36.0)
MCV: 92.2 fL (ref 78.0–100.0)
PLATELETS: 198 10*3/uL (ref 150–400)
RBC: 3.72 MIL/uL — ABNORMAL LOW (ref 3.87–5.11)
RDW: 12.7 % (ref 11.5–15.5)
WBC: 4.8 10*3/uL (ref 4.0–10.5)

## 2016-09-30 LAB — URINALYSIS, ROUTINE W REFLEX MICROSCOPIC
BILIRUBIN URINE: NEGATIVE
Bacteria, UA: NONE SEEN
GLUCOSE, UA: NEGATIVE mg/dL
KETONES UR: NEGATIVE mg/dL
LEUKOCYTES UA: NEGATIVE
Nitrite: NEGATIVE
PH: 6 (ref 5.0–8.0)
Protein, ur: 30 mg/dL — AB
SQUAMOUS EPITHELIAL / LPF: NONE SEEN
Specific Gravity, Urine: 1.021 (ref 1.005–1.030)
WBC, UA: NONE SEEN WBC/hpf (ref 0–5)

## 2016-09-30 LAB — WET PREP, GENITAL
Clue Cells Wet Prep HPF POC: NONE SEEN
Sperm: NONE SEEN
TRICH WET PREP: NONE SEEN
WBC, Wet Prep HPF POC: NONE SEEN
YEAST WET PREP: NONE SEEN

## 2016-09-30 LAB — POCT PREGNANCY, URINE: Preg Test, Ur: NEGATIVE

## 2016-09-30 MED ORDER — ONDANSETRON 8 MG PO TBDP
8.0000 mg | ORAL_TABLET | Freq: Once | ORAL | Status: AC
  Administered 2016-09-30: 8 mg via ORAL
  Filled 2016-09-30: qty 1

## 2016-09-30 MED ORDER — AZITHROMYCIN 250 MG PO TABS
1000.0000 mg | ORAL_TABLET | Freq: Once | ORAL | Status: AC
  Administered 2016-09-30: 1000 mg via ORAL
  Filled 2016-09-30: qty 4

## 2016-09-30 NOTE — MAU Provider Note (Signed)
Chief Complaint: Exposure to STD   First Provider Initiated Contact with Patient 09/30/16 1736      SUBJECTIVE HPI: Ashlee Peterson is a 20 y.o. G0P0000 who presents to maternity admissions reporting she was treated for chlamydia 2 months ago but her partner was never treated and she has had sex with him. She also started having vaginal bleeding that is heavy with clots today, requiring a pad change every 2-3 hours.  She usually has light periods r/t her Nexplanon contraception.  She reports mild abdominal cramping, consistent with her usual period cramping. She has not taken any medications or tried any treatments. The bleeding is constant and unchanged since onset.  The pain is intermittent, and is currently resolved while in MAU. She denies vaginal itching/burning, urinary symptoms, h/a, dizziness, n/v, or fever/chills.     HPI  Past Medical History:  Diagnosis Date  . Anxiety   . Asthma   . Depression   . Glaucoma   . Hypothyroidism   . Thyroid disease    Past Surgical History:  Procedure Laterality Date  . EYE SURGERY    . WISDOM TOOTH EXTRACTION     Social History   Social History  . Marital status: Single    Spouse name: N/A  . Number of children: N/A  . Years of education: N/A   Occupational History  . Not on file.   Social History Main Topics  . Smoking status: Never Smoker  . Smokeless tobacco: Never Used  . Alcohol use No  . Drug use: No  . Sexual activity: Yes    Birth control/ protection: Implant   Other Topics Concern  . Not on file   Social History Narrative  . No narrative on file   No current facility-administered medications on file prior to encounter.    Current Outpatient Prescriptions on File Prior to Encounter  Medication Sig Dispense Refill  . albuterol (PROVENTIL HFA;VENTOLIN HFA) 108 (90 BASE) MCG/ACT inhaler Inhale 1-2 puffs into the lungs every 6 (six) hours as needed for wheezing or shortness of breath. 1 Inhaler 2  .  bimatoprost (LUMIGAN) 0.03 % ophthalmic solution Place 1 drop into the right eye at bedtime.    . brimonidine (ALPHAGAN) 0.15 % ophthalmic solution Place 1 drop into the right eye daily.    . dorzolamide-timolol (COSOPT) 22.3-6.8 MG/ML ophthalmic solution Place 1 drop into the right eye daily.    [name]. escitalopram (LEXAPRO) 10 MG tablet Take 10 mg by mouth daily.    [name]. levothyroxine (SYNTHROID, LEVOTHROID) 100 MCG tablet Take 100 mcg by mouth daily before breakfast.    . pantoprazole (PROTONIX) 40 MG tablet Take 1 tablet (40 mg total) by mouth daily. 30 tablet 2  . ibuprofen (ADVIL,MOTRIN) 600 MG tablet Take 1 tablet (600 mg total) by mouth every 6 (six) hours as needed. (Patient not taking: Reported on 09/30/2016) 30 tablet 0  . ondansetron (ZOFRAN ODT) 4 MG disintegrating tablet Take 1 tablet (4 mg total) by mouth every 8 (eight) hours as needed for nausea or vomiting. (Patient not taking: Reported on 09/30/2016) 1 tablet 0   Allergies  Allergen Reactions  . Peanut-Containing Drug Products Hives  . Bee Venom Swelling    ROS:  Review of Systems  Constitutional: Negative for chills, fatigue and fever.  Respiratory: Negative for shortness of breath.   Cardiovascular: Negative for chest pain.  Genitourinary: Positive for pelvic pain and vaginal bleeding. Negative for difficulty urinating, dysuria, flank pain, vaginal discharge and vaginal pain.  Neurological: Negative for dizziness and headaches.  Psychiatric/Behavioral: Negative.      I have reviewed patient's Past Medical Hx, Surgical Hx, Family Hx, Social Hx, medications and allergies.   Physical Exam  Patient Vitals for the past 24 hrs:  BP Temp Temp src Pulse Resp Height Weight  09/30/16 1650 121/71 98.2 F (36.8 C) Oral 62 18 5\' 6"  (1.676 m) 223 lb (101.2 kg)   Constitutional: Well-developed, well-nourished female in no acute distress.  Cardiovascular: normal rate Respiratory: normal effort GI: Abd soft, non-tender. Pos BS x  4 MS: Extremities nontender, no edema, normal ROM Neurologic: Alert and oriented x 4.  GU: Neg CVAT.  PELVIC EXAM: Cervix pink, visually closed, without lesion, moderate amount dark red bleeding, vaginal walls and external genitalia normal Bimanual exam: Cervix 0/long/high, firm, anterior, neg CMT, uterus nontender, nonenlarged, adnexa without tenderness, enlargement, or mass   LAB RESULTS Results for orders placed or performed during the hospital encounter of 09/30/16 (from the past 24 hour(s))  Urinalysis, Routine w reflex microscopic     Status: Abnormal   Collection Time: 09/30/16  4:26 PM  Result Value Ref Range   Color, Urine YELLOW YELLOW   APPearance HAZY (A) CLEAR   Specific Gravity, Urine 1.021 1.005 - 1.030   pH 6.0 5.0 - 8.0   Glucose, UA NEGATIVE NEGATIVE mg/dL   Hgb urine dipstick LARGE (A) NEGATIVE   Bilirubin Urine NEGATIVE NEGATIVE   Ketones, ur NEGATIVE NEGATIVE mg/dL   Protein, ur 30 (A) NEGATIVE mg/dL   Nitrite NEGATIVE NEGATIVE   Leukocytes, UA NEGATIVE NEGATIVE   RBC / HPF TOO NUMEROUS TO COUNT 0 - 5 RBC/hpf   WBC, UA NONE SEEN 0 - 5 WBC/hpf   Bacteria, UA NONE SEEN NONE SEEN   Squamous Epithelial / LPF NONE SEEN NONE SEEN   Mucous PRESENT   Pregnancy, urine POC     Status: None   Collection Time: 09/30/16  5:20 PM  Result Value Ref Range   Preg Test, Ur NEGATIVE NEGATIVE  Wet prep, genital     Status: None   Collection Time: 09/30/16  5:53 PM  Result Value Ref Range   Yeast Wet Prep HPF POC NONE SEEN NONE SEEN   Trich, Wet Prep NONE SEEN NONE SEEN   Clue Cells Wet Prep HPF POC NONE SEEN NONE SEEN   WBC, Wet Prep HPF POC NONE SEEN NONE SEEN   Sperm NONE SEEN   CBC     Status: Abnormal   Collection Time: 09/30/16  6:09 PM  Result Value Ref Range   WBC 4.8 4.0 - 10.5 K/uL   RBC 3.72 (L) 3.87 - 5.11 MIL/uL   Hemoglobin 11.7 (L) 12.0 - 15.0 g/dL   HCT 16.1 (L) 09.6 - 04.5 %   MCV 92.2 78.0 - 100.0 fL   MCH 31.5 26.0 - 34.0 pg   MCHC 34.1 30.0 -  36.0 g/dL   RDW 40.9 81.1 - 91.4 %   Platelets 198 150 - 400 K/uL       IMAGING No results found.  MAU Management/MDM: Ordered labs and reviewed results.  GCC pending but pt was re-exposed to untreated partner.  Retreat with azithromycin 1000 mg PO x 1.  Zofran 8 mg ODT given prior to meds because pt had vomiting with dose 2 months ago.  Rx written for pt female partner and expedited partner therapy materials printed. Bleeding precautions given. Pt stable at time of discharge.  ASSESSMENT 1. Chlamydia   2.  Breakthrough bleeding on Nexplanon     PLAN Discharge home Allergies as of 09/30/2016      Reactions   Peanut-containing Drug Products Hives   Bee Venom Swelling      Medication List    STOP taking these medications   ondansetron 4 MG disintegrating tablet Commonly known as:  ZOFRAN ODT     TAKE these medications   albuterol 108 (90 Base) MCG/ACT inhaler Commonly known as:  PROVENTIL HFA;VENTOLIN HFA Inhale 1-2 puffs into the lungs every 6 (six) hours as needed for wheezing or shortness of breath.   bimatoprost 0.03 % ophthalmic solution Commonly known as:  LUMIGAN Place 1 drop into the right eye at bedtime.   brimonidine 0.15 % ophthalmic solution Commonly known as:  ALPHAGAN Place 1 drop into the right eye daily.   dorzolamide-timolol 22.3-6.8 MG/ML ophthalmic solution Commonly known as:  COSOPT Place 1 drop into the right eye daily.   escitalopram 10 MG tablet Commonly known as:  LEXAPRO Take 10 mg by mouth daily.   ibuprofen 600 MG tablet Commonly known as:  ADVIL,MOTRIN Take 1 tablet (600 mg total) by mouth every 6 (six) hours as needed.   levothyroxine 100 MCG tablet Commonly known as:  SYNTHROID, LEVOTHROID Take 100 mcg by mouth daily before breakfast.   pantoprazole 40 MG tablet Commonly known as:  PROTONIX Take 1 tablet (40 mg total) by mouth daily.      Follow-up Information    [facility].   Why:  Call for  appointment for STD testing/treatment. Return to MAU for emergencies. Contact information: [address] [place] [address] [phone]           Sharen CounterLisa Leftwich-Kirby Certified Nurse-Midwife 09/30/2016  6:56 PM

## 2016-09-30 NOTE — MAU Note (Signed)
Pt was treated for chlamydia 1 month ago. Had un protected sex and partner had not gotten treated.  Pt started having vaginal bleeding 4 days ago. Bleeding gort heavier today. Pt on explanon ( not sure when her last period was) when she does bleed it is usually light.

## 2016-09-30 NOTE — Discharge Instructions (Signed)
Chlamydia, Female Chlamydia is an infection. It is spread through sexual contact. Chlamydia can be in different areas of the body. These areas include the cervix, urethra, throat, or rectum. You may not know you have chlamydia because many people never develop the symptoms. Chlamydia is not difficult to treat once you know you have it. However, if it is left untreated, chlamydia can lead to more serious health problems.  CAUSES  Chlamydia is caused by bacteria. It is a sexually transmitted disease. It is passed from an infected partner during intimate contact. This contact could be with the genitals, mouth, or rectal area. Chlamydia can also be passed from mothers to babies during birth. SIGNS AND SYMPTOMS  There may not be any symptoms. This is often the case early in the infection. If symptoms develop, they may include:  Mild pain and discomfort when urinating.  Redness, soreness, and swelling (inflammation) of the rectum.  Vaginal discharge.  Painful intercourse.  Abdominal pain.  Bleeding between menstrual periods. DIAGNOSIS  To diagnose this infection, your health care provider will do a pelvic exam. Cultures will be taken of the vagina, cervix, urine, and possibly the rectum to verify the diagnosis.  TREATMENT You will be given antibiotic medicines. If you are pregnant, certain types of antibiotics will need to be avoided. Any sexual partners should also be treated, even if they do not show symptoms. Your health care provider may test you for infection again 3 months after treatment. HOME CARE INSTRUCTIONS   Take your antibiotic medicine as directed by your health care provider. Finish the antibiotic even if you start to feel better.  Take medicines only as directed by your health care provider.  Inform any sexual partners about the infection. They should also be treated.  Do not have sexual contact until your health care provider tells you it is okay.  Get plenty of  rest.  Eat a well-balanced diet.  Drink enough fluids to keep your urine clear or pale yellow.  Keep all follow-up visits as directed by your health care provider. SEEK MEDICAL CARE IF:  You have painful urination.  You have abdominal pain.  You have vaginal discharge.  You have painful sexual intercourse.  You have bleeding between periods and after sex.  You have a fever. SEEK IMMEDIATE MEDICAL CARE IF:   You experience nausea or vomiting.  You experience excessive sweating (diaphoresis).  You have difficulty swallowing. This information is not intended to replace advice given to you by your health care provider. Make sure you discuss any questions you have with your health care provider. Document Released: 07/14/2005 Document Revised: 06/25/2015 Document Reviewed: 06/11/2013 Elsevier Interactive Patient Education  2017 [employer] Inc.   Metrorrhagia Metrorrhagia is bleeding from the uterus that happens irregularly but often. The bleeding generally happens between menstrual periods. Follow these instructions at home: Pay attention to any changes in your symptoms. Follow these instructions to help with your condition: Eating and drinking  Eat well-balanced meals. Include foods that are high in iron, such as liver, meat, shellfish, green leafy vegetables, and eggs.  If you become constipated:  Drink plenty of water.  Eat fruits and vegetables that are high in water and fiber, such as spinach, carrots, raspberries, apples, and mango. Medicines  Take over-the-counter and prescription medicines only as told by your health care provider.  Do not change medicines without talking with your health care provider.  Aspirin or medicines that contain aspirin may make the bleeding worse. Do not take those  medicines:  During the week before your period.  During your period.  If you were prescribed iron pills, take them as told by your health care provider. Iron pills help  to replace iron that your body loses because of this condition. Activity  If you need to change your sanitary pad or tampon more than one time every 2 hours:  Lie in bed with your feet raised (elevated).  Place a cold pack on your lower abdomen.  Rest as much as possible until the bleeding stops or slows down.  Do not try to lose weight until the bleeding has stopped and your blood iron level is back to normal. Other Instructions  For two months, write down:  When your period starts.  When your period ends.  When any abnormal bleeding occurs.  What problems you notice.  Keep all follow-up visits as told by your health care provider. This is important. Contact a health care provider if:  You get light-headed or weak.  You have nausea and vomiting.  You cannot eat or drink without vomiting.  You feel dizzy or have diarrhea while you are taking medicine.  You are taking birth control pills or hormones, and you want to change them or stop taking them. Get help right away if:  You develop a fever or chills.  You need to change your sanitary pad or tampon more than one time per hour.  Your bleeding becomesheavy.  Your flow contains clots.  You develop pain in your abdomen.  You lose consciousness.  You develop a rash. This information is not intended to replace advice given to you by your health care provider. Make sure you discuss any questions you have with your health care provider. Document Released: 10/04/2005 Document Revised: 03/08/2016 Document Reviewed: 12/30/2014 Elsevier Interactive Patient Education  2017 [employer]

## 2016-10-01 LAB — GC/CHLAMYDIA PROBE AMP (~~LOC~~) NOT AT ARMC
Chlamydia: NEGATIVE
Neisseria Gonorrhea: NEGATIVE

## 2016-10-04 ENCOUNTER — Telehealth: Payer: Self-pay | Admitting: *Deleted

## 2016-10-04 NOTE — Telephone Encounter (Signed)
-----   Message from Hermina StaggersMichael L Ervin, MD sent at 10/04/2016 11:39 AM EST ----- Please let pt know that her GC/C is negative Thanks Casimiro NeedleMichael

## 2016-10-04 NOTE — Telephone Encounter (Signed)
Called pt and informed her of negative GC/CT test result. Per chart review, pt had concerns that her partner did not get treated for the Chlamydia infection several months ago. If she is sure that her partner never got treated for the infection, he should seek treatment with his doctor or [facility].  Pt voiced understanding.

## 2016-11-29 ENCOUNTER — Ambulatory Visit (HOSPITAL_COMMUNITY)
Admission: EM | Admit: 2016-11-29 | Discharge: 2016-11-29 | Disposition: A | Discharge status: Home / Self Care | Payer: BLUE CROSS/BLUE SHIELD | Attending: Family Medicine | Admitting: Family Medicine

## 2016-11-29 ENCOUNTER — Encounter (HOSPITAL_COMMUNITY): Payer: Self-pay | Admitting: Emergency Medicine

## 2016-11-29 DIAGNOSIS — R0789 Other chest pain: Secondary | ICD-10-CM | POA: Diagnosis not present

## 2016-11-29 HISTORY — DX: Gastro-esophageal reflux disease without esophagitis: K21.9

## 2016-11-29 MED ORDER — MELOXICAM 7.5 MG PO TABS: 7.5000 mg | ORAL_TABLET | Freq: Two times a day (BID) | ORAL | 1 refills | Status: DC

## 2016-11-29 NOTE — ED Provider Notes (Signed)
[facility]    CSN: [id number] Arrival date & time: 11/29/16  1121     History   Chief Complaint Chief Complaint  Patient presents with  . Chest Pain  . Back Pain    HPI Ashlee Peterson is a 21 y.o. female.   The history is provided by the patient.  Chest Pain  Pain location:  L chest Pain quality: sharp   Pain radiates to:  Mid back Pain severity:  No pain Onset quality:  Sudden Progression:  Unchanged Chronicity:  New Relieved by:  Certain positions Worsened by:  Movement Associated symptoms: back pain   Associated symptoms: no abdominal pain   Back Pain  Associated symptoms: chest pain   Associated symptoms: no abdominal pain     Past Medical History:  Diagnosis Date  . Anxiety   . Asthma   . Depression   . GERD (gastroesophageal reflux disease)   . Glaucoma   . Hypothyroidism   . Thyroid disease     There are no active problems to display for this patient.   Past Surgical History:  Procedure Laterality Date  . EYE SURGERY    . WISDOM TOOTH EXTRACTION      OB History    Gravida Para Term Preterm AB Living   0 0 0 0 0 0   SAB TAB Ectopic Multiple Live Births   0 0 0 0 0       Home Medications    Prior to Admission medications   Medication Sig Start Date End Date Taking? Authorizing Provider  bimatoprost (LUMIGAN) 0.03 % ophthalmic solution Place 1 drop into the right eye at bedtime.   Yes Historical Provider, MD  brimonidine (ALPHAGAN) 0.15 % ophthalmic solution Place 1 drop into the right eye daily.   Yes Historical Provider, MD  dorzolamide-timolol (COSOPT) 22.3-6.8 MG/ML ophthalmic solution Place 1 drop into the right eye daily.   Yes Historical Provider, MD  escitalopram (LEXAPRO) 10 MG tablet Take 10 mg by mouth daily.   Yes Historical Provider, MD  ibuprofen (ADVIL,MOTRIN) 600 MG tablet Take 1 tablet (600 mg total) by mouth every 6 (six) hours as needed. 08/16/16  Yes Harolyn RutherfordJennifer I Rasch, NP  levothyroxine  (SYNTHROID, LEVOTHROID) 100 MCG tablet Take 100 mcg by mouth daily before breakfast.   Yes Historical Provider, MD  pantoprazole (PROTONIX) 40 MG tablet Take 1 tablet (40 mg total) by mouth daily. 08/16/16  Yes Duane LopeJennifer I Rasch, NP  albuterol (PROVENTIL HFA;VENTOLIN HFA) 108 (90 BASE) MCG/ACT inhaler Inhale 1-2 puffs into the lungs every 6 (six) hours as needed for wheezing or shortness of breath. 05/17/15   Elwin MochaBlair Walden, MD  meloxicam (MOBIC) 7.5 MG tablet Take 1 tablet (7.5 mg total) by mouth 2 (two) times daily after a meal. 11/29/16   Linna HoffJames D Nylee Barbuto, MD    Family History History reviewed. No pertinent family history.  Social History Social History  Substance Use Topics  . Smoking status: Never Smoker  . Smokeless tobacco: Never Used  . Alcohol use No     Allergies   Peanut-containing drug products and Bee venom   Review of Systems Review of Systems  Constitutional: Negative.   HENT: Negative.   Respiratory: Negative.   Cardiovascular: Positive for chest pain.  Gastrointestinal: Negative.  Negative for abdominal pain.  Musculoskeletal: Positive for back pain.  All other systems reviewed and are negative.    Physical Exam Triage Vital Signs ED Triage Vitals [11/29/16 1308]  Enc Vitals Group  BP 126/83     Pulse Rate 75     Resp      Temp 98 F (36.7 C)     Temp Source Oral     SpO2 98 %     Weight      Height      Head Circumference      Peak Flow      Pain Score 6     Pain Loc      Pain Edu?      Excl. in GC?    No data found.   Updated Vital Signs BP 126/83 (BP Location: Right Arm)   Pulse 75   Temp 98 F (36.7 C) (Oral)   LMP 10/24/2016 (Approximate)   SpO2 98%   Visual Acuity Right Eye Distance:   Left Eye Distance:   Bilateral Distance:    Right Eye Near:   Left Eye Near:    Bilateral Near:     Physical Exam  Constitutional: She appears well-developed and well-nourished.  HENT:  Right Ear: External ear normal.  Left Ear: External  ear normal.  Eyes: Pupils are equal, round, and reactive to light.  Neck: Normal range of motion. Neck supple.  Cardiovascular: Normal rate, regular rhythm, normal heart sounds and intact distal pulses.   Pulmonary/Chest: Effort normal and breath sounds normal. She exhibits tenderness.  Abdominal: Soft. Bowel sounds are normal.  Lymphadenopathy:    She has no cervical adenopathy.  Skin: Skin is warm and dry.  Nursing note and vitals reviewed.    UC Treatments / Results  Labs (all labs ordered are listed, but only abnormal results are displayed) Labs Reviewed - No data to display  EKG  EKG Interpretation None       Radiology No results found.  Procedures Procedures (including critical care time)  Medications Ordered in UC Medications - No data to display   Initial Impression / Assessment and Plan / UC Course  I have reviewed the triage vital signs and the nursing notes.  Pertinent labs & imaging results that were available during my care of the patient were reviewed by me and considered in my medical decision making (see chart for details).       Final Clinical Impressions(s) / UC Diagnoses   Final diagnoses:  Chest wall pain    New Prescriptions New Prescriptions   MELOXICAM (MOBIC) 7.5 MG TABLET    Take 1 tablet (7.5 mg total) by mouth 2 (two) times daily after a meal.     Linna Hoff, MD 11/29/16 1339

## 2016-11-29 NOTE — ED Triage Notes (Signed)
Pt reports a history of GERD.  She is on medication, but for the last week she has had intermittent sharp pain in her mid chest and back that is accompanied by some SOB.

## 2016-12-15 ENCOUNTER — Other Ambulatory Visit: Payer: Self-pay | Admitting: Physician Assistant

## 2016-12-15 DIAGNOSIS — R1310 Dysphagia, unspecified: Secondary | ICD-10-CM

## 2016-12-15 DIAGNOSIS — R1319 Other dysphagia: Secondary | ICD-10-CM

## 2016-12-17 ENCOUNTER — Ambulatory Visit
Admission: RE | Admit: 2016-12-17 | Discharge: 2016-12-17 | Disposition: A | Discharge status: Home / Self Care | Payer: BLUE CROSS/BLUE SHIELD | Source: Ambulatory Visit | Attending: Physician Assistant | Admitting: Physician Assistant

## 2016-12-17 DIAGNOSIS — R1310 Dysphagia, unspecified: Secondary | ICD-10-CM

## 2016-12-17 DIAGNOSIS — R1319 Other dysphagia: Secondary | ICD-10-CM

## 2017-01-28 ENCOUNTER — Inpatient Hospital Stay (HOSPITAL_COMMUNITY)
Admission: AD | Admit: 2017-01-28 | Discharge: 2017-01-28 | Disposition: A | Discharge status: Home / Self Care | Payer: BLUE CROSS/BLUE SHIELD | Source: Ambulatory Visit | Attending: Obstetrics and Gynecology | Admitting: Obstetrics and Gynecology

## 2017-01-28 ENCOUNTER — Encounter (HOSPITAL_COMMUNITY): Payer: Self-pay

## 2017-01-28 DIAGNOSIS — F329 Major depressive disorder, single episode, unspecified: Secondary | ICD-10-CM | POA: Diagnosis not present

## 2017-01-28 DIAGNOSIS — Z3202 Encounter for pregnancy test, result negative: Secondary | ICD-10-CM | POA: Insufficient documentation

## 2017-01-28 DIAGNOSIS — Z91030 Bee allergy status: Secondary | ICD-10-CM | POA: Diagnosis not present

## 2017-01-28 DIAGNOSIS — Z9889 Other specified postprocedural states: Secondary | ICD-10-CM | POA: Diagnosis not present

## 2017-01-28 DIAGNOSIS — Z833 Family history of diabetes mellitus: Secondary | ICD-10-CM | POA: Insufficient documentation

## 2017-01-28 DIAGNOSIS — K2100 Gastro-esophageal reflux disease with esophagitis, without bleeding: Secondary | ICD-10-CM

## 2017-01-28 DIAGNOSIS — E039 Hypothyroidism, unspecified: Secondary | ICD-10-CM | POA: Insufficient documentation

## 2017-01-28 DIAGNOSIS — K219 Gastro-esophageal reflux disease without esophagitis: Secondary | ICD-10-CM | POA: Insufficient documentation

## 2017-01-28 DIAGNOSIS — Z8249 Family history of ischemic heart disease and other diseases of the circulatory system: Secondary | ICD-10-CM | POA: Insufficient documentation

## 2017-01-28 DIAGNOSIS — F419 Anxiety disorder, unspecified: Secondary | ICD-10-CM | POA: Insufficient documentation

## 2017-01-28 DIAGNOSIS — H409 Unspecified glaucoma: Secondary | ICD-10-CM | POA: Diagnosis not present

## 2017-01-28 DIAGNOSIS — J45909 Unspecified asthma, uncomplicated: Secondary | ICD-10-CM | POA: Diagnosis not present

## 2017-01-28 DIAGNOSIS — Z91010 Allergy to peanuts: Secondary | ICD-10-CM | POA: Diagnosis not present

## 2017-01-28 DIAGNOSIS — R079 Chest pain, unspecified: Secondary | ICD-10-CM | POA: Diagnosis present

## 2017-01-28 LAB — CBC
HCT: 35.9 % — ABNORMAL LOW (ref 36.0–46.0)
Hemoglobin: 12.1 g/dL (ref 12.0–15.0)
MCH: 29.7 pg (ref 26.0–34.0)
MCHC: 33.7 g/dL (ref 30.0–36.0)
MCV: 88.2 fL (ref 78.0–100.0)
PLATELETS: 217 10*3/uL (ref 150–400)
RBC: 4.07 MIL/uL (ref 3.87–5.11)
RDW: 14 % (ref 11.5–15.5)
WBC: 5.6 10*3/uL (ref 4.0–10.5)

## 2017-01-28 LAB — TROPONIN I: Troponin I: <0.03 ng/mL (ref <0.03)

## 2017-01-28 LAB — POCT PREGNANCY, URINE: Preg Test, Ur: NEGATIVE

## 2017-01-28 MED ORDER — PANTOPRAZOLE SODIUM 40 MG PO TBEC: 40.0000 mg | DELAYED_RELEASE_TABLET | Freq: Two times a day (BID) | ORAL | 0 refills | Status: AC

## 2017-01-28 MED ORDER — GI COCKTAIL ~~LOC~~
30.0000 mL | Freq: Once | ORAL | Status: AC
  Administered 2017-01-28: 30 mL via ORAL
  Filled 2017-01-28: qty 30

## 2017-01-28 NOTE — MAU Note (Signed)
Chest pain woke me up about 0300. In upper mid chest but does radiate downward to mid to lower chest. Hx GERD but this feels different. Cont off and on all day. Took my Protonix this am but has not really helped.

## 2017-01-28 NOTE — Discharge Instructions (Signed)
Food Choices for Gastroesophageal Reflux Disease, Adult When you have gastroesophageal reflux disease (GERD), the foods you eat and your eating habits are very important. Choosing the right foods can help ease your discomfort. What guidelines do I need to follow?  Choose fruits, vegetables, whole grains, and low-fat dairy products.  Choose low-fat meat, fish, and poultry.  Limit fats such as oils, salad dressings, butter, nuts, and avocado.  Keep a food diary. This helps you identify foods that cause symptoms.  Avoid foods that cause symptoms. These may be different for everyone.  Eat small meals often instead of 3 large meals a day.  Eat your meals slowly, in a place where you are relaxed.  Limit fried foods.  Cook foods using methods other than frying.  Avoid drinking alcohol.  Avoid drinking large amounts of liquids with your meals.  Avoid bending over or lying down until 2-3 hours after eating. What foods are not recommended? These are some foods and drinks that may make your symptoms worse: Vegetables  Tomatoes. Tomato juice. Tomato and spaghetti sauce. Chili peppers. Onion and garlic. Horseradish. Fruits  Oranges, grapefruit, and lemon (fruit and juice). Meats  High-fat meats, fish, and poultry. This includes hot dogs, ribs, ham, sausage, salami, and bacon. Dairy  Whole milk and chocolate milk. Sour cream. Cream. Butter. Ice cream. Cream cheese. Drinks  Coffee and tea. Bubbly (carbonated) drinks or energy drinks. Condiments  Hot sauce. Barbecue sauce. Sweets/Desserts  Chocolate and cocoa. Donuts. Peppermint and spearmint. Fats and Oils  High-fat foods. This includes French fries and potato chips. Other  Vinegar. Strong spices. This includes black pepper, white pepper, red pepper, cayenne, curry powder, cloves, ginger, and chili powder. The items listed above may not be a complete list of foods and drinks to avoid. Contact your dietitian for more information.    This information is not intended to replace advice given to you by your health care provider. Make sure you discuss any questions you have with your health care provider. Document Released: 04/04/2012 Document Revised: 03/11/2016 Document Reviewed: 08/08/2013 Elsevier Interactive Patient Education  2017 [employer]  

## 2017-01-28 NOTE — MAU Provider Note (Signed)
History     CSN: [id number]  Arrival date and time: 01/28/17 2008   First Provider Initiated Contact with Patient 01/28/17 2026      Chief Complaint  Patient presents with  . Chest Pain   HPI [name] is a 21 y.o. [id number] who presents to MAU today with complaint of chest pain since 0300 today. The patient takes Protonix daily for GERD and is managed by [facility]. She states that she took her Protonix this morning without relief. She has had mild epigastric discomfort as well. She denies N/V or other issues.   OB History    Gravida Para Term Preterm AB Living   0 0 0 0 0 0   SAB TAB Ectopic Multiple Live Births   0 0 0 0 0      Past Medical History:  Diagnosis Date  . Anxiety   . Asthma   . Depression   . GERD (gastroesophageal reflux disease)   . Glaucoma   . Hypothyroidism   . Thyroid disease     Past Surgical History:  Procedure Laterality Date  . EYE SURGERY    . WISDOM TOOTH EXTRACTION      Family History  Problem Relation Age of Onset  . Hypertension Father   . Diabetes Father     Social History  Substance Use Topics  . Smoking status: Never Smoker  . Smokeless tobacco: Never Used  . Alcohol use No    Allergies:  Allergies  Allergen Reactions  . Peanut-Containing Drug Products Hives  . Bee Venom Swelling    Prescriptions Prior to Admission  Medication Sig Dispense Refill Last Dose  . albuterol (PROVENTIL HFA;VENTOLIN HFA) 108 (90 BASE) MCG/ACT inhaler Inhale 1-2 puffs into the lungs every 6 (six) hours as needed for wheezing or shortness of breath. 1 Inhaler 2 Past Month at Unknown time  . bimatoprost (LUMIGAN) 0.03 % ophthalmic solution Place 1 drop into the right eye at bedtime.   01/27/2017 at Unknown time  . brimonidine (ALPHAGAN) 0.15 % ophthalmic solution Place 1 drop into the right eye daily.   01/28/2017 at Unknown time  . dorzolamide-timolol (COSOPT) 22.3-6.8 MG/ML ophthalmic solution Place 1 drop into the right eye  daily.   01/28/2017 at Unknown time  . etonogestrel (NEXPLANON) 68 MG IMPL implant 1 each by Subdermal route once.     [name] levothyroxine (SYNTHROID, LEVOTHROID) 100 MCG tablet Take 100 mcg by mouth daily before breakfast.   01/28/2017 at Unknown time  . pantoprazole (PROTONIX) 40 MG tablet Take 1 tablet (40 mg total) by mouth daily. 30 tablet 2 01/28/2017 at Unknown time  . escitalopram (LEXAPRO) 10 MG tablet Take 10 mg by mouth daily.   More than a month at Unknown time  . ibuprofen (ADVIL,MOTRIN) 600 MG tablet Take 1 tablet (600 mg total) by mouth every 6 (six) hours as needed. 30 tablet 0 11/28/2016 at Unknown time  . meloxicam (MOBIC) 7.5 MG tablet Take 1 tablet (7.5 mg total) by mouth 2 (two) times daily after a meal. 30 tablet 1     Review of Systems  Constitutional: Negative for fever.  Respiratory: Negative for chest tightness and shortness of breath.   Cardiovascular: Positive for chest pain.  Gastrointestinal: Positive for abdominal pain. Negative for nausea and vomiting.   Physical Exam   Blood pressure 129/63, pulse 78, temperature 98.1 F (36.7 C), resp. rate 18, height 5' 6.5" (1.689 m), weight 203 lb (92.1 kg), last menstrual period 01/24/2017,  SpO2 100 %.  Physical Exam  Nursing note and vitals reviewed. Constitutional: She is oriented to person, place, and time. She appears well-developed and well-nourished. No distress.  HENT:  Head: Normocephalic and atraumatic.  Cardiovascular: Normal rate.   Respiratory: Effort normal.  GI: Soft. She exhibits no distension.  Neurological: She is alert and oriented to person, place, and time.  Skin: Skin is warm and dry. No erythema.  Psychiatric: She has a normal mood and affect.    Results for orders placed or performed during the hospital encounter of 01/28/17 (from the past 24 hour(s))  Troponin I     Status: None   Collection Time: 01/28/17  8:33 PM  Result Value Ref Range   Troponin I <0.03 <0.03 ng/mL  CBC     Status:  Abnormal   Collection Time: 01/28/17  8:33 PM  Result Value Ref Range   WBC 5.6 4.0 - 10.5 K/uL   RBC 4.07 3.87 - 5.11 MIL/uL   Hemoglobin 12.1 12.0 - 15.0 g/dL   HCT 16.1 (L) 09.6 - 04.5 %   MCV 88.2 78.0 - 100.0 fL   MCH 29.7 26.0 - 34.0 pg   MCHC 33.7 30.0 - 36.0 g/dL   RDW 40.9 81.1 - 91.4 %   Platelets 217 150 - 400 K/uL  Pregnancy, urine POC     Status: None   Collection Time: 01/28/17  8:41 PM  Result Value Ref Range   Preg Test, Ur NEGATIVE NEGATIVE    MAU Course  Procedures None  MDM CBC, troponin, EKG and GI cocktail EKG - NSR GI cocktail improved burning pain, but still some pain Troponin negative, CBC essentially normal Assessment and Plan  A: GERD exacerbation   P: Discharge home Rx for Protonix at increased dose given to patient  Warning signs for worsening condition and GERD diet discussed Patient advised to follow-up with [facility] as planned, but call them on on Monday to advise of worsening symptoms. Patient has endoscopy scheduled for next week. Also advised that if symptoms worsen over the weekend she should call after hours number for [facility] and/or present to  [facility] or [facility]  Patient may return to MAU as needed or if her condition were to change or worsen   Marny Lowenstein, PA-C  01/28/2017, 9:26 PM

## 2017-02-03 ENCOUNTER — Encounter (HOSPITAL_COMMUNITY): Payer: Self-pay | Admitting: Emergency Medicine

## 2017-02-03 ENCOUNTER — Ambulatory Visit (HOSPITAL_COMMUNITY)
Admission: EM | Admit: 2017-02-03 | Discharge: 2017-02-03 | Disposition: A | Discharge status: Nursing Home (Includes ICF) | Payer: BLUE CROSS/BLUE SHIELD | Source: Home / Self Care

## 2017-02-03 ENCOUNTER — Emergency Department (HOSPITAL_COMMUNITY)
Admission: EM | Admit: 2017-02-03 | Discharge: 2017-02-04 | Disposition: A | Discharge status: Home / Self Care | Payer: BLUE CROSS/BLUE SHIELD | Attending: Emergency Medicine | Admitting: Emergency Medicine

## 2017-02-03 ENCOUNTER — Emergency Department (HOSPITAL_COMMUNITY): Payer: BLUE CROSS/BLUE SHIELD

## 2017-02-03 ENCOUNTER — Encounter (HOSPITAL_COMMUNITY): Payer: Self-pay

## 2017-02-03 DIAGNOSIS — N9489 Other specified conditions associated with female genital organs and menstrual cycle: Secondary | ICD-10-CM | POA: Insufficient documentation

## 2017-02-03 DIAGNOSIS — R0789 Other chest pain: Secondary | ICD-10-CM | POA: Diagnosis not present

## 2017-02-03 DIAGNOSIS — R002 Palpitations: Secondary | ICD-10-CM | POA: Diagnosis not present

## 2017-02-03 DIAGNOSIS — Z79899 Other long term (current) drug therapy: Secondary | ICD-10-CM | POA: Diagnosis not present

## 2017-02-03 DIAGNOSIS — E860 Dehydration: Secondary | ICD-10-CM

## 2017-02-03 DIAGNOSIS — R079 Chest pain, unspecified: Secondary | ICD-10-CM | POA: Diagnosis present

## 2017-02-03 DIAGNOSIS — E039 Hypothyroidism, unspecified: Secondary | ICD-10-CM | POA: Insufficient documentation

## 2017-02-03 DIAGNOSIS — J45909 Unspecified asthma, uncomplicated: Secondary | ICD-10-CM | POA: Insufficient documentation

## 2017-02-03 LAB — BASIC METABOLIC PANEL
ANION GAP: 8 (ref 5–15)
BUN: 7 mg/dL (ref 6–20)
CO2: 25 mmol/L (ref 22–32)
Calcium: 9.4 mg/dL (ref 8.9–10.3)
Chloride: 106 mmol/L (ref 101–111)
Creatinine, Ser: 0.9 mg/dL (ref 0.44–1.00)
GFR calc Af Amer: >60 mL/min (ref >60)
GFR calc non Af Amer: >60 mL/min (ref >60)
GLUCOSE: 102 mg/dL — AB (ref 65–99)
POTASSIUM: 3.5 mmol/L (ref 3.5–5.1)
Sodium: 139 mmol/L (ref 135–145)

## 2017-02-03 LAB — CBC
HCT: 36.3 % (ref 36.0–46.0)
HEMOGLOBIN: 12.3 g/dL (ref 12.0–15.0)
MCH: 29.3 pg (ref 26.0–34.0)
MCHC: 33.9 g/dL (ref 30.0–36.0)
MCV: 86.4 fL (ref 78.0–100.0)
Platelets: 224 10*3/uL (ref 150–400)
RBC: 4.2 MIL/uL (ref 3.87–5.11)
RDW: 13.5 % (ref 11.5–15.5)
WBC: 5.7 10*3/uL (ref 4.0–10.5)

## 2017-02-03 LAB — TROPONIN I: Troponin I: <0.03 ng/mL (ref <0.03)

## 2017-02-03 LAB — HCG, QUANTITATIVE, PREGNANCY

## 2017-02-03 NOTE — ED Triage Notes (Signed)
Pt endorses "feeling like I'm going to faint" and pt endorses chest pain that began at 1300 today. Pt ambulatory. VSS.

## 2017-02-03 NOTE — ED Provider Notes (Signed)
CSN: [id number]     Arrival date & time 02/03/17  1929 History   None    Chief Complaint  Patient presents with  . Chest Pain  . Dizziness   (Consider location/radiation/quality/duration/timing/severity/associated sxs/prior Treatment) Patient states she has developed substernal chest pain that is moderate and she feels like she may pass out.   The history is provided by the patient.  Chest Pain  Pain location:  Substernal area Pain quality: aching   Pain radiates to:  Does not radiate Pain severity:  Moderate Onset quality:  Sudden Timing:  Constant Progression:  Worsening Chronicity:  New   Past Medical History:  Diagnosis Date  . Anxiety   . Asthma   . Depression   . GERD (gastroesophageal reflux disease)   . Glaucoma   . Hypothyroidism   . Thyroid disease    Past Surgical History:  Procedure Laterality Date  . EYE SURGERY    . WISDOM TOOTH EXTRACTION     Family History  Problem Relation Age of Onset  . Hypertension Father   . Diabetes Father    Social History  Substance Use Topics  . Smoking status: Never Smoker  . Smokeless tobacco: Never Used  . Alcohol use No   OB History    Gravida Para Term Preterm AB Living   0 0 0 0 0 0   SAB TAB Ectopic Multiple Live Births   0 0 0 0 0     Review of Systems  Constitutional: Negative.   HENT: Negative.   Eyes: Negative.   Respiratory: Negative.   Cardiovascular: Positive for chest pain.  Gastrointestinal: Negative.   Endocrine: Negative.   Genitourinary: Negative.   Musculoskeletal: Negative.   Allergic/Immunologic: Negative.   Neurological: Negative.   Hematological: Negative.   Psychiatric/Behavioral: Negative.     Allergies  Peanut-containing drug products and Bee venom  Home Medications   Prior to Admission medications   Medication Sig Start Date End Date Taking? Authorizing Provider  albuterol (PROVENTIL HFA;VENTOLIN HFA) 108 (90 BASE) MCG/ACT inhaler Inhale 1-2 puffs into the lungs every  6 (six) hours as needed for wheezing or shortness of breath. 05/17/15  Yes Elwin Mocha, MD  bimatoprost (LUMIGAN) 0.03 % ophthalmic solution Place 1 drop into the right eye at bedtime.   Yes Historical Provider, MD  brimonidine (ALPHAGAN) 0.15 % ophthalmic solution Place 1 drop into the right eye daily.   Yes Historical Provider, MD  dorzolamide-timolol (COSOPT) 22.3-6.8 MG/ML ophthalmic solution Place 1 drop into the right eye daily.   Yes Historical Provider, MD  escitalopram (LEXAPRO) 10 MG tablet Take 10 mg by mouth daily.   Yes Historical Provider, MD  etonogestrel (NEXPLANON) 68 MG IMPL implant 1 each by Subdermal route once.   Yes Historical Provider, MD  levothyroxine (SYNTHROID, LEVOTHROID) 100 MCG tablet Take 100 mcg by mouth daily before breakfast.   Yes Historical Provider, MD  pantoprazole (PROTONIX) 40 MG tablet Take 1 tablet (40 mg total) by mouth 2 (two) times daily before a meal. 01/28/17  Yes Marny Lowenstein, PA-C   Meds Ordered and Administered this Visit  Medications - No data to display  LMP 01/24/2017  No data found.   Physical Exam  Constitutional: She is oriented to person, place, and time. She appears well-developed and well-nourished.  HENT:  Head: Normocephalic and atraumatic.  Eyes: Conjunctivae and EOM are normal. Pupils are equal, round, and reactive to light.  Neck: Normal range of motion. Neck supple.  Cardiovascular: Normal rate,  regular rhythm and normal heart sounds.   Pulmonary/Chest: Effort normal and breath sounds normal.  Abdominal: Soft. Bowel sounds are normal.  Neurological: She is alert and oriented to person, place, and time.  Nursing note and vitals reviewed.   Urgent Care Course     Procedures (including critical care time)  Labs Review Labs Reviewed - No data to display  Imaging Review No results found.   Visual Acuity Review  Right Eye Distance:   Left Eye Distance:   Bilateral Distance:    Right Eye Near:   Left Eye Near:     Bilateral Near:         MDM   1. Other chest pain    Recommend going to Emergency Room for evaluation of Chest pain.    Deatra Canter, FNP 02/03/17 2034

## 2017-02-03 NOTE — ED Triage Notes (Signed)
Pt c/o CP and dizziness that started today around 1300 while work   Pain increases w/activity  Denies SOB, diaphoresis, n/v  A&O x4... NAD

## 2017-02-03 NOTE — Discharge Instructions (Signed)
Please go straight to Emergency Room  °

## 2017-02-04 DIAGNOSIS — R0789 Other chest pain: Secondary | ICD-10-CM | POA: Diagnosis not present

## 2017-02-04 MED ORDER — SODIUM CHLORIDE 0.9 % IV BOLUS (SEPSIS)
1000.0000 mL | Freq: Once | INTRAVENOUS | Status: AC
Start: 2017-02-04 — End: 2017-02-04
  Administered 2017-02-04: 1000 mL via INTRAVENOUS

## 2017-02-04 MED ORDER — ONDANSETRON HCL 4 MG/2ML IJ SOLN
4.0000 mg | Freq: Once | INTRAMUSCULAR | Status: AC
  Administered 2017-02-04: 4 mg via INTRAVENOUS
  Filled 2017-02-04: qty 2

## 2017-02-04 NOTE — ED Notes (Addendum)
EDP at bedside  

## 2017-02-04 NOTE — ED Provider Notes (Signed)
[facility]-EMERGENCY DEPT Provider Note   CSN: [id number] Arrival date & time: 02/03/17  2021  By signing my name below, I, Cynda Acres, attest that this documentation has been prepared under the direction and in the presence of Jacalyn Lefevre, MD. Electronically Signed: Cynda Acres, Scribe. 02/04/17. 12:08 AM.  History   Chief Complaint Chief Complaint  Patient presents with  . Chest Pain   HPI Comments: Ashlee Peterson is a 21 y.o. female with a history of GERD and anxiety, who presents to the Emergency Department complaining of sudden-onset, constant chest pain that began earlier tonight. 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 Patient states she was under a lot of stress while at work. Patient reports having poor fluid intake yesterday. Patient reports associated dizziness and palpitations. No modifying factors indicated. Patient denies fever, vertigo, abdominal pain, nausea, vomiting, or diaphoresis.   The history is provided by the patient. No language interpreter was used.    Past Medical History:  Diagnosis Date  . Anxiety   . Asthma   . Depression   . GERD (gastroesophageal reflux disease)   . Glaucoma   . Hypothyroidism   . Thyroid disease     There are no active problems to display for this patient.   Past Surgical History:  Procedure Laterality Date  . EYE SURGERY    . WISDOM TOOTH EXTRACTION      OB History    Gravida Para Term Preterm AB Living   0 0 0 0 0 0   SAB TAB Ectopic Multiple Live Births   0 0 0 0 0       Home Medications    Prior to Admission medications   Medication Sig Start Date End Date Taking? Authorizing Provider  albuterol (PROVENTIL HFA;VENTOLIN HFA) 108 (90 BASE) MCG/ACT inhaler Inhale 1-2 puffs into the lungs every 6 (six) hours as needed for wheezing or shortness of breath. 05/17/15  Yes Elwin Mocha, MD  bimatoprost (LUMIGAN) 0.03 % ophthalmic solution Place 1 drop into the  right eye at bedtime.   Yes Historical Provider, MD  brimonidine (ALPHAGAN) 0.15 % ophthalmic solution Place 1 drop into the right eye daily.   Yes Historical Provider, MD  dorzolamide-timolol (COSOPT) 22.3-6.8 MG/ML ophthalmic solution Place 1 drop into the right eye daily.   Yes Historical Provider, MD  escitalopram (LEXAPRO) 10 MG tablet Take 10 mg by mouth daily.   Yes Historical Provider, MD  etonogestrel (NEXPLANON) 68 MG IMPL implant 1 each by Subdermal route once.   Yes Historical Provider, MD  levothyroxine (SYNTHROID, LEVOTHROID) 100 MCG tablet Take 100 mcg by mouth daily before breakfast.   Yes Historical Provider, MD  pantoprazole (PROTONIX) 40 MG tablet Take 1 tablet (40 mg total) by mouth 2 (two) times daily before a meal. 01/28/17  Yes Marny Lowenstein, PA-C    Family History Family History  Problem Relation Age of Onset  . Hypertension Father   . Diabetes Father     Social History Social History  Substance Use Topics  . Smoking status: Never Smoker  . Smokeless tobacco: Never Used  . Alcohol use No     Allergies   Peanut-containing drug products and Bee venom   Review of Systems Review of Systems  Constitutional: Negative for diaphoresis and fever.  Cardiovascular: Positive for chest pain and palpitations.  Gastrointestinal: Negative for abdominal pain, nausea and vomiting.  Neurological: Positive for dizziness.  All other systems reviewed  and are negative.    Physical Exam Updated Vital Signs BP (!) 111/56   Pulse 65   Temp 98.4 F (36.9 C) (Oral)   Resp 12   Ht 5' 6.5" (1.689 m)   Wt 203 lb (92.1 kg)   LMP 01/24/2017 (Exact Date)   SpO2 100%   BMI 32.27 kg/m   Physical Exam  Constitutional: She is oriented to person, place, and time. She appears well-developed.  HENT:  Head: Normocephalic and atraumatic.  Mouth/Throat: Oropharynx is clear and moist.  Eyes: Conjunctivae and EOM are normal. Pupils are equal, round, and reactive to light.  Neck:  Normal range of motion. Neck supple.  Cardiovascular: Normal rate and regular rhythm.   Pulmonary/Chest: Effort normal and breath sounds normal. She exhibits no tenderness.  Abdominal: Soft. Bowel sounds are normal.  Musculoskeletal: Normal range of motion.  Neurological: She is alert and oriented to person, place, and time.  Skin: Skin is warm and dry.  Psychiatric: She has a normal mood and affect.  Nursing note and vitals reviewed.    ED Treatments / Results  DIAGNOSTIC STUDIES: Oxygen Saturation is 100% on RA, normal by my interpretation.    COORDINATION OF CARE: 12:07 AM Discussed treatment plan with pt at bedside and pt agreed to plan, which includes IV fluids.   Labs (all labs ordered are listed, but only abnormal results are displayed) Labs Reviewed  BASIC METABOLIC PANEL - Abnormal; Notable for the following:       Result Value   Glucose, Bld 102 (*)    All other components within normal limits  CBC  TROPONIN I  HCG, QUANTITATIVE, PREGNANCY    EKG  EKG Interpretation  Date/Time:  Thursday February 03 2017 20:25:56 EDT Ventricular Rate:  78 PR Interval:  136 QRS Duration: 86 QT Interval:  376 QTC Calculation: 428 R Axis:   70 Text Interpretation:  Normal sinus rhythm with sinus arrhythmia Normal ECG Confirmed by Mylei Brackeen MD, Epsie Walthall ([id number]) on 02/03/2017 11:57:57 PM       Radiology Dg Chest 2 View  Result Date: 02/03/2017 CLINICAL DATA:  Chest pain EXAM: CHEST  2 VIEW COMPARISON:  None. FINDINGS: The heart size and mediastinal contours are within normal limits. Both lungs are clear. The visualized skeletal structures are unremarkable. IMPRESSION: No active cardiopulmonary disease. Electronically Signed   By: Deatra Robinson M.D.   On: 02/03/2017 21:00    Procedures Procedures (including critical care time)  Medications Ordered in ED Medications  sodium chloride 0.9 % bolus 1,000 mL (0 mLs Intravenous Stopped 02/04/17 0205)  ondansetron (ZOFRAN) injection 4 mg  (4 mg Intravenous Given 02/04/17 0020)     Initial Impression / Assessment and Plan / ED Course  I have reviewed the triage vital signs and the nursing notes.  Pertinent labs & imaging results that were available during my care of the patient were reviewed by me and considered in my medical decision making (see chart for details).    Pt is feeling better after IVFs.  I don't think pt has a PE as she is not hypoxic and not sob.  The pt knows to return if worse.  She said that her doctor in [place] told her that she needed an ECHO, but she never got it done b/c she moved here for school.  She is given the number of cardiology to f/u.   Final Clinical Impressions(s) / ED Diagnoses   Final diagnoses:  Atypical chest pain  Dehydration  Palpitations  New Prescriptions New Prescriptions   No medications on file   I personally performed the services described in this documentation, which was scribed in my presence. The recorded information has been reviewed and is accurate.     Jacalyn Lefevre, MD 02/04/17 Earle Gell

## 2017-02-07 ENCOUNTER — Ambulatory Visit (INDEPENDENT_AMBULATORY_CARE_PROVIDER_SITE_OTHER): Payer: BLUE CROSS/BLUE SHIELD | Admitting: Cardiovascular Disease

## 2017-02-07 ENCOUNTER — Encounter: Payer: Self-pay | Admitting: Cardiovascular Disease

## 2017-02-07 VITALS — BP 113/66 | HR 72 | Ht 66.5 in | Wt 200.4 lb

## 2017-02-07 DIAGNOSIS — E039 Hypothyroidism, unspecified: Secondary | ICD-10-CM | POA: Diagnosis not present

## 2017-02-07 DIAGNOSIS — F419 Anxiety disorder, unspecified: Secondary | ICD-10-CM

## 2017-02-07 DIAGNOSIS — R002 Palpitations: Secondary | ICD-10-CM | POA: Diagnosis not present

## 2017-02-07 DIAGNOSIS — R0789 Other chest pain: Secondary | ICD-10-CM | POA: Diagnosis not present

## 2017-02-07 DIAGNOSIS — J4520 Mild intermittent asthma, uncomplicated: Secondary | ICD-10-CM

## 2017-02-07 DIAGNOSIS — J45909 Unspecified asthma, uncomplicated: Secondary | ICD-10-CM | POA: Insufficient documentation

## 2017-02-07 HISTORY — DX: Palpitations: R00.2

## 2017-02-07 HISTORY — DX: Other chest pain: R07.89

## 2017-02-07 NOTE — Patient Instructions (Signed)
Medication Instructions:  ?Your physician recommends that you continue on your current medications as directed. Please refer to the Current Medication list given to you today.  ? ?Labwork: ?NONE ? ?Testing/Procedures: ?NONE ? ?Follow-Up: ?AS NEEDED  ? ?  ?

## 2017-02-07 NOTE — Progress Notes (Signed)
Cardiology Office Note   Date:  02/07/2017   ID:  Ashlee Peterson, DOB 1996/07/02, MRN [id number]  PCP:  PROVIDER NOT IN SYSTEM  Cardiologist:   Chilton Si, MD   Chief Complaint  Patient presents with  . New Evaluation  . Hospitalization Follow-up    Seen in ER for chest pain, pt c/o a little pain in chest this morning.Ashlee Peterson      History of Present Illness: Ashlee Peterson is a 21 y.o. female with asthma, anxiety, GERD, and hypothyroidism who presents for an evaluation of chest pain.  Ashlee Peterson  Was seen in the ED 02/04/17 ith sudden onset of chest pain. She reported bing under a lot of stress at work wen th episode began.   EKG was unremarkable.  She reports years of palpitations dating back to when she was in middle school. These occur approximately once per week and lasts for a few seconds at a time. The episode she had prior to the emergency department was much more severe and lasted for approximately 10 minutes. It was associated with chest pressure. She was at work Chief of Staff at the time. She was feeling anxious when it occurred. She sat down, took deep breaths, and drink water, which helped her symptoms.  Ashlee Peterson works out at [employer] and walk/runs on the treadmill. She has no chest pain or shortness of breath with this activity. She has not noted any lower extremity edema, orthopnea, or PND. She does not drink caffeine or use over-the-counter cold or cough medications. In high school she had a cardiac MRI for dizziness. This was reportedly within normal limits. It occurred in [place], [place] But she is unsure the name of the facility. 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  She is studying biology and wants to be an infectious disease doctor.     Past Medical History:  Diagnosis Date  . Anxiety   . Asthma   . Atypical chest pain 02/07/2017  . Depression   . GERD (gastroesophageal reflux disease)   . Glaucoma   . Hypothyroidism     . Palpitations 02/07/2017  . Thyroid disease     Past Surgical History:  Procedure Laterality Date  . EYE SURGERY    . WISDOM TOOTH EXTRACTION       Current Outpatient Prescriptions  Medication Sig Dispense Refill  . albuterol (PROVENTIL HFA;VENTOLIN HFA) 108 (90 BASE) MCG/ACT inhaler Inhale 1-2 puffs into the lungs every 6 (six) hours as needed for wheezing or shortness of breath. 1 Inhaler 2  . bimatoprost (LUMIGAN) 0.03 % ophthalmic solution Place 1 drop into the right eye at bedtime.    . brimonidine (ALPHAGAN) 0.15 % ophthalmic solution Place 1 drop into the right eye daily.    . dorzolamide-timolol (COSOPT) 22.3-6.8 MG/ML ophthalmic solution Place 1 drop into the right eye daily.    Ashlee Peterson escitalopram (LEXAPRO) 10 MG tablet Take 10 mg by mouth daily.    Ashlee Peterson etonogestrel (NEXPLANON) 68 MG IMPL implant 1 each by Subdermal route once.    Ashlee Peterson levothyroxine (SYNTHROID, LEVOTHROID) 100 MCG tablet Take 100 mcg by mouth daily before breakfast.    . pantoprazole (PROTONIX) 40 MG tablet Take 1 tablet (40 mg total) by mouth 2 (two) times daily before a meal. 30 tablet 0   No current facility-administered medications for this visit.     Allergies:   Peanut-containing drug products and Bee venom    Social History:  The patient  reports that she has never smoked. She has never used smokeless tobacco. She reports that she does not drink alcohol or use drugs.   Family History:  The patient's family history includes Diabetes in her father; Hypertension in her brother and father; Pulmonary embolism in her cousin; Stroke in her maternal aunt.    ROS:  Please see the history of present illness.   Otherwise, review of systems are positive for none.   All other systems are reviewed and negative.    PHYSICAL EXAM: VS:  BP 113/66   Pulse 72   Ht 5' 6.5" (1.689 m)   Wt 90.9 kg (200 lb 6.4 oz)   LMP 01/24/2017 (Exact Date)   BMI 31.86 kg/m  , BMI Body mass index is 31.86 kg/m. GENERAL:  Well  appearing HEENT:  Pupils equal round and reactive, fundi not visualized, oral mucosa unremarkable NECK:  No jugular venous distention, waveform within normal limits, carotid upstroke brisk and symmetric, no bruits, no thyromegaly LYMPHATICS:  No cervical adenopathy LUNGS:  Clear to auscultation bilaterally HEART:  RRR.  PMI not displaced or sustained,S1 and S2 within normal limits, no S3, no S4, no clicks, no rubs, no murmurs ABD:  Flat, positive bowel sounds normal in frequency in pitch, no bruits, no rebound, no guarding, no midline pulsatile mass, no hepatomegaly, no splenomegaly EXT:  2 plus pulses throughout, no edema, no cyanosis no clubbing SKIN:  No rashes no nodules NEURO:  Cranial nerves II through XII grossly intact, motor grossly intact throughout PSYCH:  Cognitively intact, oriented to person place and time    EKG:  EKG is not ordered today. The ekg ordered 02/04/17 demonstrates  Sinus rhythm. Rate 78 bpm.   Recent Labs: 02/03/2017: BUN 7; Creatinine, Ser 0.90; Hemoglobin 12.3; Platelets 224; Potassium 3.5; Sodium 139    Lipid Panel No results found for: CHOL, TRIG, HDL, CHOLHDL, VLDL, LDLCALC, LDLDIRECT    Wt Readings from Last 3 Encounters:  02/07/17 90.9 kg (200 lb 6.4 oz) (97 %, Z= 1.95)*  02/03/17 92.1 kg (203 lb) (98 %, Z= 1.98)*  01/28/17 92.1 kg (203 lb) (98 %, Z= 1.98)*   * Growth percentiles are based on CDC 2-20 Years data.      ASSESSMENT AND PLAN:  # Atypical chest pain: # Palpitations: Symptoms are atypical.  It sounds more like she had a panic attack been an ischemic event. She is able to exercise without symptoms, making it unlikely that this is related to ischemia. Given that she is artery had a cardiac MRI she presumably does not have any congenital abnormalities. Therefore, we will not pursue any additional testing at this time. It sounds that she has PACs/PVCs at baseline but no sustained arrhythmias. If she develops recurrent symptoms we will  have her wear an ambulatory monitor.   Thyroid function was checked when she went home last month and was reportedly within normal limits.  All other lab work was within normal limits and the emergency department.     Current medicines are reviewed at length with the patient today.  The patient does not have concerns regarding medicines.  The following changes have been made:  no change  Labs/ tests ordered today include:  No orders of the defined types were placed in this encounter.    Disposition:   FU with Colby Catanese C. Duke Salvia, MD, [facility] as needed.    This note was written with the assistance of speech recognition software.  Please excuse any transcriptional errors.  Signed,  Samuel Rittenhouse C. Duke Salvia, MD, [facility]  02/07/2017 12:51 PM    [facility]

## 2017-03-17 ENCOUNTER — Ambulatory Visit: Payer: BLUE CROSS/BLUE SHIELD | Admitting: Cardiovascular Disease

## 2017-03-21 NOTE — Progress Notes (Signed)
Cardiology Office Note    Date:  03/22/2017   ID:  Ashlee Peterson, DOB 1995/11/21, MRN [id number]  PCP:  System, Provider Not In  Cardiologist: Dr. Duke Salvia   Chief Complaint  Patient presents with  . Follow-up    Palpitations    History of Present Illness:    Ashlee Peterson is a 21 y.o. female with past medical history of asthma, GERD, hypothyroidism, and anxiety who presents to the office today for evaluation of palpitations.   She was examined by Dr. Duke Salvia in 01/2017 as a new patient referral after being evaluated in the ED for chest pain. She reported having palpitations for 10+ years. Her EKG showed NSR with a HR of 78. Her symptoms were thought to be most consistent with a panic attack. Her a prior cardiac MRI showing no acute abnormalities and recent labs showing no abnormalities, no further testing was recommended. If she had recurrent symptoms, could consider an ambulatory monitor.    In talking with the patient today, she reports having very frequent palpitations since her last office visit. She reports these can occur at rest or with exertion and typically last for up to 15 minutes before spontaneously resolving. She denies any associated lightheadedness, dizziness, presyncope, chest pain, or dyspnea on exertion.  She has been running on the treadmill without any exertional symptoms. 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  She reports limiting her caffeine intake and only consuming tea a few times per week, no coffee. She denies any alcohol intake. Recent labs were checked and showed stable TSH, Hgb, and electrolytes.     Past Medical History:  Diagnosis Date  . Anxiety   . Asthma   . Atypical chest pain 02/07/2017  . Depression   . GERD (gastroesophageal reflux disease)   . Glaucoma   . Hypothyroidism   . Palpitations 02/07/2017  . Thyroid disease      Past Surgical History:  Procedure Laterality Date  . EYE SURGERY    . WISDOM TOOTH EXTRACTION      Current Medications: Outpatient Medications Prior to Visit  Medication Sig Dispense Refill  . albuterol (PROVENTIL HFA;VENTOLIN HFA) 108 (90 BASE) MCG/ACT inhaler Inhale 1-2 puffs into the lungs every 6 (six) hours as needed for wheezing or shortness of breath. 1 Inhaler 2  . bimatoprost (LUMIGAN) 0.03 % ophthalmic solution Place 1 drop into the right eye at bedtime.    . brimonidine (ALPHAGAN) 0.15 % ophthalmic solution Place 1 drop into the right eye daily.    . dorzolamide-timolol (COSOPT) 22.3-6.8 MG/ML ophthalmic solution Place 1 drop into the right eye daily.    [name] escitalopram (LEXAPRO) 10 MG tablet Take 10 mg by mouth daily.    [name] etonogestrel (NEXPLANON) 68 MG IMPL implant 1 each by Subdermal route once.    [name] levothyroxine (SYNTHROID, LEVOTHROID) 100 MCG tablet Take 100 mcg by mouth daily before breakfast.    . pantoprazole (PROTONIX) 40 MG tablet Take 1 tablet (40 mg total) by mouth 2 (two) times daily before a meal. 30 tablet 0   No facility-administered medications prior to visit.      Allergies:   Peanut-containing drug products and Bee venom   Social History   Social History  . Marital status: Single    Spouse name: N/A  . Number of children: N/A  . Years of education: N/A   Social History Main Topics  .  Smoking status: Never Smoker  . Smokeless tobacco: Never Used  . Alcohol use No  . Drug use: No  . Sexual activity: Yes    Birth control/ protection: Implant   Other Topics Concern  . None   Social History Narrative  . None     Family History:  The patient's family history includes Diabetes in her father; Hypertension in her brother and father; Pulmonary embolism in her cousin; Stroke in her maternal aunt.   Review of Systems:   Please see the history of present illness.     General:  No chills, fever, night sweats or weight changes.  Cardiovascular:   No chest pain, dyspnea on exertion, edema, orthopnea, paroxysmal nocturnal dyspnea. Positive for palpitations.  Dermatological: No rash, lesions/masses Respiratory: No cough, dyspnea Urologic: No hematuria, dysuria Abdominal:   No nausea, vomiting, diarrhea, bright red blood per rectum, melena, or hematemesis Neurologic:  No visual changes, wkns, changes in mental status. All other systems reviewed and are otherwise negative except as noted above.   Physical Exam:    VS:  BP 116/70   Pulse 84   Ht 5' 6.5" (1.689 m)   Wt 204 lb 9.6 oz (92.8 kg)   SpO2 99%   BMI 32.53 kg/m    General: Well developed, well nourished [place] female appearing in no acute distress. Head: Normocephalic, atraumatic, sclera non-icteric, no xanthomas, nares are without discharge.  Neck: No carotid bruits. JVD not elevated.  Lungs: Respirations regular and unlabored, without wheezes or rales.  Heart: Regular rate and rhythm. No S3 or S4.  No murmur, no rubs, or gallops appreciated. Abdomen: Soft, non-tender, non-distended with normoactive bowel sounds. No hepatomegaly. No rebound/guarding. No obvious abdominal masses. Msk:  Strength and tone appear normal for age. No joint deformities or effusions. Extremities: No clubbing or cyanosis. No lower extremity edema.  Distal pedal pulses are 2+ bilaterally. Neuro: Alert and oriented X 3. Moves all extremities spontaneously. No focal deficits noted. Psych:  Responds to questions appropriately with a normal affect. Skin: No rashes or lesions noted  Wt Readings from Last 3 Encounters:  03/22/17 204 lb 9.6 oz (92.8 kg)  02/07/17 200 lb 6.4 oz (90.9 kg) (97 %, Z= 1.95)*  02/03/17 203 lb (92.1 kg) (98 %, Z= 1.98)*   * Growth percentiles are based on CDC 2-20 Years data.     Studies/Labs Reviewed:   EKG:  EKG is ordered today.  The ekg ordered today demonstrates sinus arrhythmia with HR of 84.  Recent Labs: 02/03/2017: BUN 7; Creatinine, Ser 0.90;  Hemoglobin 12.3; Platelets 224; Potassium 3.5; Sodium 139   Lipid Panel No results found for: CHOL, TRIG, HDL, CHOLHDL, VLDL, LDLCALC, LDLDIRECT  Additional studies/ records that were reviewed today include:   CXR: 02/03/2017: No active cardiopulmonary disease.   Assessment:    1. Palpitations   2. Anxiety   3. Gastroesophageal reflux disease, esophagitis presence not specified   4. Hypothyroidism, unspecified type      Plan:   In order of problems listed above:  1. Palpitations - she reports a history of palpitations for 10+ years. At the time of her initial evaluation two months ago, no further testing was recommended unless she had recurrent symptoms, at which time an ambulatory monitor could be considered.  - she reports having very frequent palpitations since her last office visit which can occur at rest or with exertion and typically last for up to 15 minutes before spontaneously resolving. She denies any associated  symptoms. - EKG today shows a sinus arrhythmia but no significant abnormalities.  - labs were recently checked and normal as above. Will obtain a 14-day cardiac event monitor as her symptoms occur 2-3 times per week. Could consider the addition of PRN BB therapy pending monitor results but would avoid for now with her soft to normal BP readings.   2. Anxiety - she reports her symptoms have been stable. She does not think anxiety precipitates her palpitations. Reports becoming anxious once the palpitations start.  - remains on Lexapro 10mg  daily.   3. GERD - she denies any recent reflux symptoms.  - continue Protonix as previously prescribed.   4. Hypothyroidism - followed by PCP.  - remains on Synthroid 100 mcg daily.   Medication Adjustments/Labs and Tests Ordered: Current medicines are reviewed at length with the patient today.  Concerns regarding medicines are outlined above.  Medication changes, Labs and Tests ordered today are listed in the Patient  Instructions below. Patient Instructions  Medication Instructions:  Your physician recommends that you continue on your current medications as directed. Please refer to the Current Medication list given to you today.  If you need a refill on your cardiac medications before your next appointment, please call your pharmacy.  Testing/Procedures: Your physician has recommended that you wear an event monitor (14 DAY EVENT MONITOR). Event monitors are medical devices that record the heart's electrical activity. Doctors most often us these monitors to diagnose arrhythmias. Arrhythmias are problems with the speed or rhythm of the heartbeat. The monitor is a small, portable device. You can wear one while you do your normal daily activities. This is usually used to diagnose what is causing palpitations/syncope (passing out).  Follow-Up: Your physician wants you to follow-up in: [facility]ED [facility]    Thank you for choosing [facility]!!      Signed, Ellsworth LennoxBrittany M Gisela Lea, PA-C  03/22/2017 4:33 PM    [facility] [address], [address] [place], [place]  [address] Phone: [phone]; Fax: [phone]  [address], [address] [place], [place] [address] Phone: [phone]

## 2017-03-22 ENCOUNTER — Encounter: Payer: Self-pay | Admitting: Student

## 2017-03-22 ENCOUNTER — Ambulatory Visit (INDEPENDENT_AMBULATORY_CARE_PROVIDER_SITE_OTHER): Payer: BLUE CROSS/BLUE SHIELD | Admitting: Student

## 2017-03-22 VITALS — BP 116/70 | HR 84 | Ht 66.5 in | Wt 204.6 lb

## 2017-03-22 DIAGNOSIS — R002 Palpitations: Secondary | ICD-10-CM | POA: Diagnosis not present

## 2017-03-22 DIAGNOSIS — E039 Hypothyroidism, unspecified: Secondary | ICD-10-CM | POA: Diagnosis not present

## 2017-03-22 DIAGNOSIS — F419 Anxiety disorder, unspecified: Secondary | ICD-10-CM

## 2017-03-22 DIAGNOSIS — K219 Gastro-esophageal reflux disease without esophagitis: Secondary | ICD-10-CM

## 2017-03-22 NOTE — Patient Instructions (Signed)
Medication Instructions:  Your physician recommends that you continue on your current medications as directed. Please refer to the Current Medication list given to you today.  If you need a refill on your cardiac medications before your next appointment, please call your pharmacy.  Testing/Procedures: Your physician has recommended that you wear an event monitor (14 DAY EVENT MONITOR). Event monitors are medical devices that record the heart's electrical activity. Doctors most often us these monitors to diagnose arrhythmias. Arrhythmias are problems with the speed or rhythm of the heartbeat. The monitor is a small, portable device. You can wear one while you do your normal daily activities. This is usually used to diagnose what is causing palpitations/syncope (passing out).  Follow-Up: Your physician wants you to follow-up in: AS NEEDED WITH [facility]    Thank you for choosing [facility]!!

## 2017-04-01 ENCOUNTER — Ambulatory Visit (INDEPENDENT_AMBULATORY_CARE_PROVIDER_SITE_OTHER): Payer: BLUE CROSS/BLUE SHIELD

## 2017-04-01 DIAGNOSIS — F419 Anxiety disorder, unspecified: Secondary | ICD-10-CM

## 2017-04-01 DIAGNOSIS — R002 Palpitations: Secondary | ICD-10-CM

## 2017-04-22 ENCOUNTER — Telehealth: Payer: Self-pay | Admitting: Cardiovascular Disease

## 2017-04-22 NOTE — Telephone Encounter (Signed)
Patient calling, states that she had an event monitor for two weeks and she just took heart monitor off. Patient states that when she took heart monitor off, she had an episode, where her heart was racing and then it slowed down. Please call ,thanks.

## 2017-04-22 NOTE — Telephone Encounter (Signed)
Spoke with patient and she stated she had an episode yesterday after eating while at rest. Her heart started racing for about a minute but then went back to normal. She was unable to count but felt as though heart rate irregular. This episode was different than any she had before. She is now only having her episodes about once a week. Patient denied any chest pains, shortness of breath, or other symptoms. Will forward to Dr Duke Salviaandolph for review

## 2017-04-26 NOTE — Telephone Encounter (Signed)
F/U call:  Patient states that she would like to know if she could come in to see Dr. Duke Salviaandolph due to an episode that she had. Patient will be moving out of state this weekend and will be gone by Monday, would like to be worked in this week before she leaves.

## 2017-04-26 NOTE — Telephone Encounter (Signed)
Discussed with Dr Duke Salviaandolph and ok to scheduled her Friday 7/13, advised patient of appointment date and time

## 2017-04-29 ENCOUNTER — Encounter: Payer: Self-pay | Admitting: Cardiovascular Disease

## 2017-04-29 ENCOUNTER — Ambulatory Visit (INDEPENDENT_AMBULATORY_CARE_PROVIDER_SITE_OTHER): Payer: BLUE CROSS/BLUE SHIELD | Admitting: Cardiovascular Disease

## 2017-04-29 VITALS — BP 126/67 | HR 69 | Ht 67.0 in | Wt 216.8 lb

## 2017-04-29 DIAGNOSIS — F419 Anxiety disorder, unspecified: Secondary | ICD-10-CM

## 2017-04-29 DIAGNOSIS — R002 Palpitations: Secondary | ICD-10-CM

## 2017-04-29 NOTE — Progress Notes (Signed)
Cardiology Office Note   Date:  04/29/2017   ID:  Ashlee Peterson, DOB [birth year], MRN [id number]  PCP:  Patient, No Pcp Per  Cardiologist:   Ashlee Siiffany Lipan, MD   No chief complaint on file.     History of Present Illness: Ashlee Peterson is a [age] y.o. female with asthma, anxiety, GERD, and hypothyroidism who presents for follow up.  She was first seen 01/2017 for an evaluation of chest pain.  Ashlee Peterson was seen in the ED 02/04/17 with sudden onset of chest pain. She reported bing under a lot of stress at work when the episode began.   EKG was unremarkable.  She reported years of palpitations dating back to when she was in middle school. She was referred for a 2 week event monitor that showed very rare PVCs.  She noted palpitations several times at which time sinus rhythm was noted.   In high school she had a cardiac MRI for dizziness. This was reportedly within normal limits. It [place] But she is unsure the name of the facility.   Since her last appointment [name] continues to have palpitations.  He typically occur with sitting down. She also had one episode when she first awakens. She denies exertional symptoms. It last for approximately 5 minutes at a time. She admits that her anxiety is not well-controlled. She stopped taking Lexapro in September because the provider at her mental health clinic left. Since then her anxiety has been much worse in the palpitations have increased. She denies chest pain, shortness of breath, lower extremity edema, orthopnea, or PND. She will be moving to [place] next week and plans to enroll at [employer].  She is studying biology and wants to be an infectious disease doctor.    Past Medical History:  Diagnosis Date  . Anxiety   . Asthma   . Atypical chest pain 02/07/2017  . Depression   . GERD (gastroesophageal reflux disease)   . Glaucoma   . Hypothyroidism   . Palpitations 02/07/2017  . Thyroid  disease     Past Surgical History:  Procedure Laterality Date  . EYE SURGERY    . WISDOM TOOTH EXTRACTION       Current Outpatient Prescriptions  Medication Sig Dispense Refill  . albuterol (PROVENTIL HFA;VENTOLIN HFA) 108 (90 BASE) MCG/ACT inhaler Inhale 1-2 puffs into the lungs every 6 (six) hours as needed for wheezing or shortness of breath. 1 Inhaler 2  . bimatoprost (LUMIGAN) 0.03 % ophthalmic solution Place 1 drop into the right eye at bedtime.    . brimonidine (ALPHAGAN) 0.15 % ophthalmic solution Place 1 drop into the right eye daily.    . dorzolamide-timolol (COSOPT) 22.3-6.8 MG/ML ophthalmic solution Place 1 drop into the right eye daily.    [name]. escitalopram (LEXAPRO) 10 MG tablet Take 10 mg by mouth daily.    [name]. etonogestrel (NEXPLANON) 68 MG IMPL implant 1 each by Subdermal route once.    [name]. levothyroxine (SYNTHROID, LEVOTHROID) 100 MCG tablet Take 100 mcg by mouth daily before breakfast.    . pantoprazole (PROTONIX) 40 MG tablet Take 1 tablet (40 mg total) by mouth 2 (two) times daily before a meal. 30 tablet 0   No current facility-administered medications for this visit.     Allergies:   Peanut-containing drug products and Bee venom    Social History:  The patient  reports that she has never smoked. She has never used smokeless tobacco. She reports  that she does not drink alcohol or use drugs.   Family History:  The patient's family history includes Diabetes in her father; Hypertension in her brother and father; Pulmonary embolism in her cousin; Stroke in her maternal aunt.    ROS:  Please see the history of present illness.   Otherwise, review of systems are positive for none.   All other systems are reviewed and negative.    PHYSICAL EXAM: VS:  BP 126/67 (BP Location: Left Arm)   Pulse 69   Ht 5\' 7"  (1.702 m)   Wt 98.3 kg (216 lb 12.8 oz)   BMI 33.96 kg/m  , BMI Body mass index is 33.96 kg/m. GENERAL:  Well appearing HEENT:  Pupils equal round and reactive,  fundi not visualized, oral mucosa unremarkable NECK:  No jugular venous distention, waveform within normal limits, carotid upstroke brisk and symmetric, no bruits, no thyromegaly LYMPHATICS:  No cervical adenopathy LUNGS:  Clear to auscultation bilaterally HEART:  RRR.  PMI not displaced or sustained,S1 and S2 within normal limits, no S3, no S4, no clicks, no rubs, no murmurs ABD:  Flat, positive bowel sounds normal in frequency in pitch, no bruits, no rebound, no guarding, no midline pulsatile mass, no hepatomegaly, no splenomegaly EXT:  2 plus pulses throughout, no edema, no cyanosis no clubbing SKIN:  No rashes no nodules NEURO:  Cranial nerves II through XII grossly intact, motor grossly intact throughout PSYCH:  Cognitively intact, oriented to person place and time   EKG:  EKG is not ordered today. The ekg ordered 02/04/17 demonstrates  Sinus rhythm. Rate 78 bpm.  15 Day Event Monitor 03/2017  Quality: Fair.  Baseline artifact. Predominant rhythm: sinus rhythm  Rare PVCs  Patient did submit a symptom diary, at which time sinus rhythm was recorded Recent Labs: 02/03/2017: BUN 7; Creatinine, Ser 0.90; Hemoglobin 12.3; Platelets 224; Potassium 3.5; Sodium 139    Lipid Panel No results found for: CHOL, TRIG, HDL, CHOLHDL, VLDL, LDLCALC, LDLDIRECT    Wt Readings from Last 3 Encounters:  04/29/17 98.3 kg (216 lb 12.8 oz)  03/22/17 92.8 kg (204 lb 9.6 oz)  02/07/17 90.9 kg (200 lb 6.4 oz) (97 %, Z= 1.95)*   * Growth percentiles are based on CDC 2-20 Years data.      ASSESSMENT AND PLAN:  # Atypical chest pain: # Palpitations: Ashlee Peterson had some sinus tachycardia and rare PVCs.  She admits that her anxiety is poorly controlled and this is likely the cause of her palpitations. We discussed starting a beta blocker, but I think she would be better served by restarting her SSRI.  She has follow-up scheduled with a mental health provider in the next month. If she continues to  have symptoms once her anxiety is better controlled, consider starting metoprolol at that time.    Current medicines are reviewed at length with the patient today.  The patient does not have concerns regarding medicines.  The following changes have been made:  no change  Labs/ tests ordered today include:  No orders of the defined types were placed in this encounter.    Disposition:   FU with Polette Nofsinger C. Duke Salvia, MD, [facility] as needed.    This note was written with the assistance of speech recognition software.  Please excuse any transcriptional errors.  Signed, Clevland Cork C. Duke Salvia, MD, [facility]  04/29/2017 10:31 AM    [facility]

## 2017-04-29 NOTE — Patient Instructions (Signed)
Medication Instructions:  ?Your physician recommends that you continue on your current medications as directed. Please refer to the Current Medication list given to you today.  ? ?Labwork: ?NONE ? ?Testing/Procedures: ?NONE ? ?Follow-Up: ?AS NEEDED  ? ?  ?

## 2017-10-11 IMAGING — CR DG CHEST 2V
2 series · 2 of 2 positions shown · non-contrast
Comparison: None.

CLINICAL DATA: Chest pain.  Shortness of breath.

EXAM:
CHEST  2 VIEW

[chest pa]
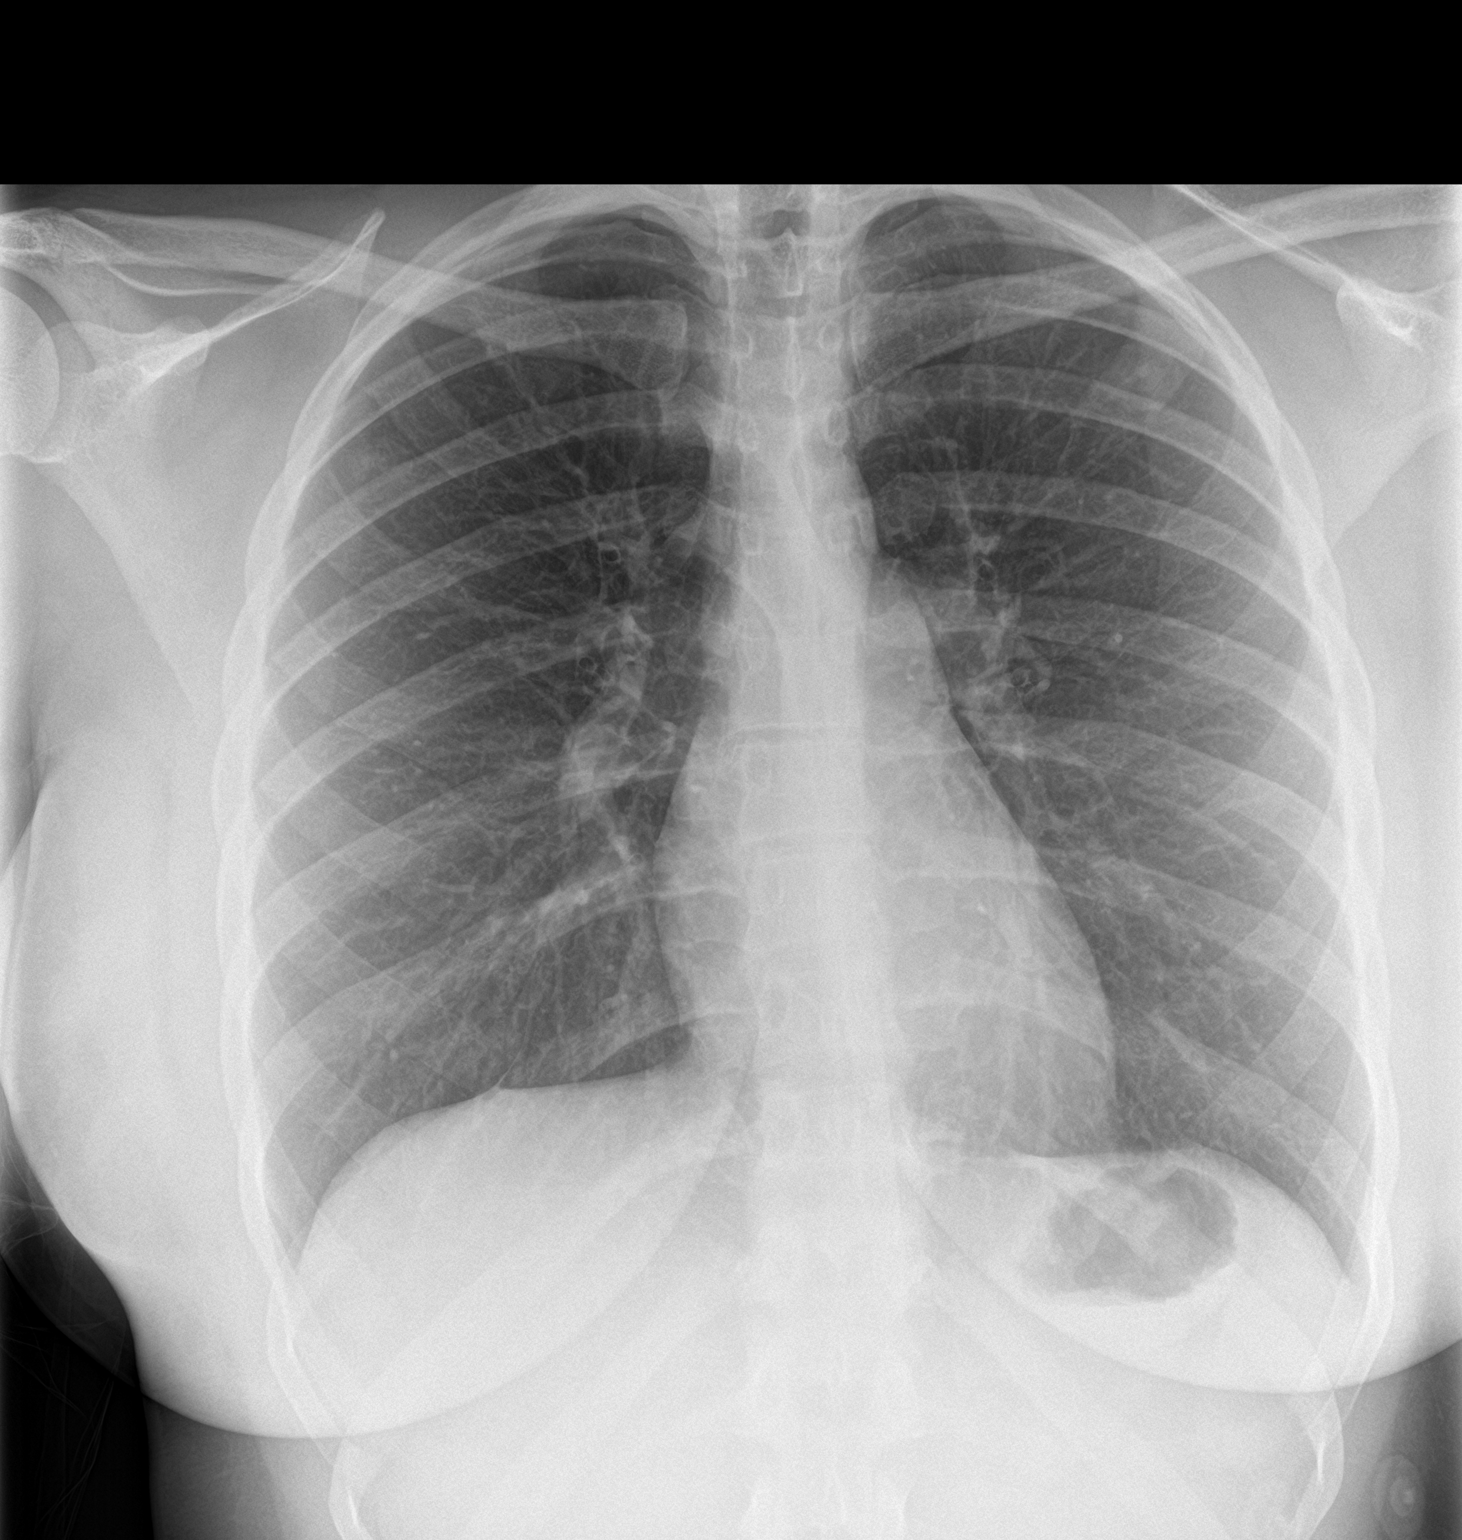

[chest lat]
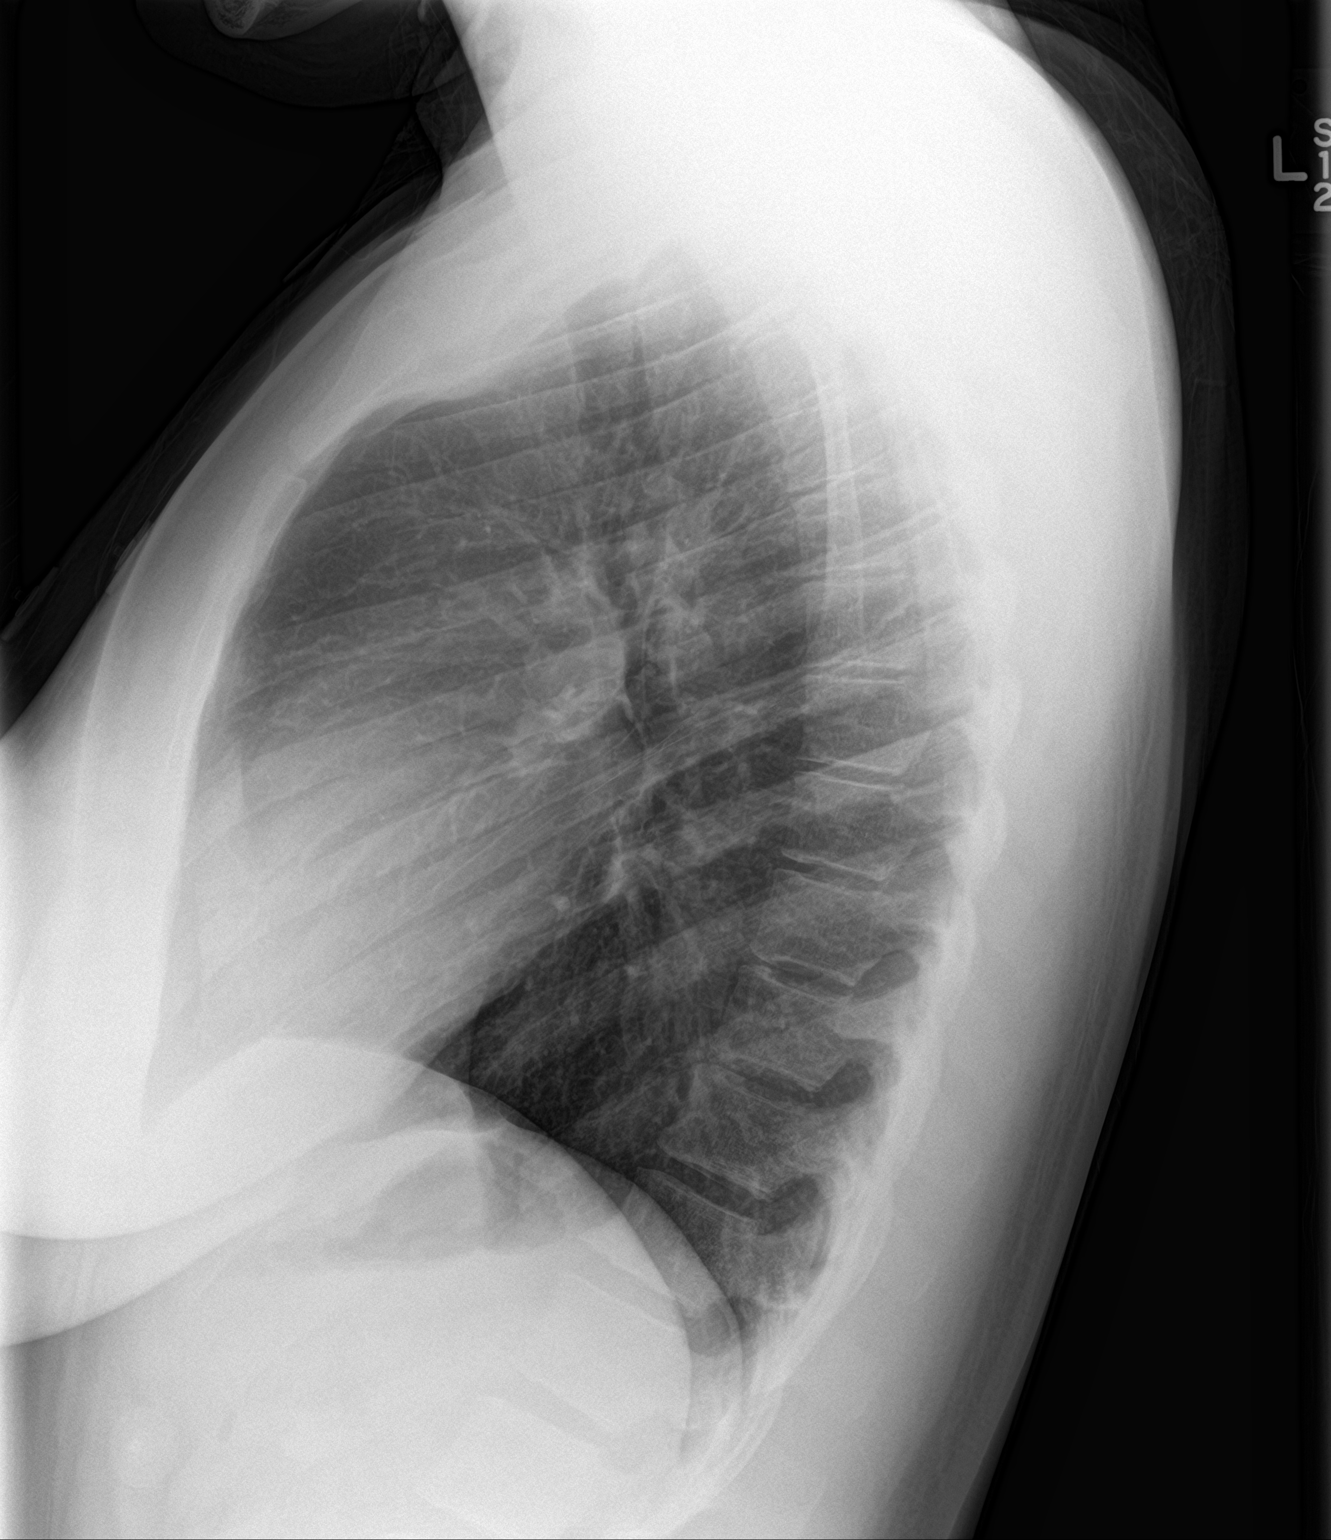

[2 of 2 positions shown; findings below may reference images not displayed]

FINDINGS: The heart size and mediastinal contours are within normal limits.
Both lungs are clear. No pneumothorax or pleural effusion is noted.
The visualized skeletal structures are unremarkable.
IMPRESSION: No active cardiopulmonary disease.

## 2018-02-13 IMAGING — RF DG ESOPHAGUS
8 series · 14 of 24 positions shown · non-contrast
Comparison: Chest radiograph 08/14/2016

CLINICAL DATA: Esophageal dysphagia

EXAM:
ESOPHOGRAM / BARIUM SWALLOW / BARIUM TABLET STUDY
TECHNIQUE: Combined double contrast and single contrast examination performed
using effervescent crystals, thick barium liquid, and thin barium
liquid. The patient was observed with fluoroscopy swallowing a 13 mm
barium sulphate tablet.
FLUOROSCOPY TIME:  Fluoroscopy Time:  1 minutes, 54 seconds
Radiation Exposure Index (if provided by the fluoroscopic device):
77 mGy
Number of Acquired Spot Images: 0

[Series 1: sequence · 2 of 21 frames shown (1 of 7)]
[frame 4/21]
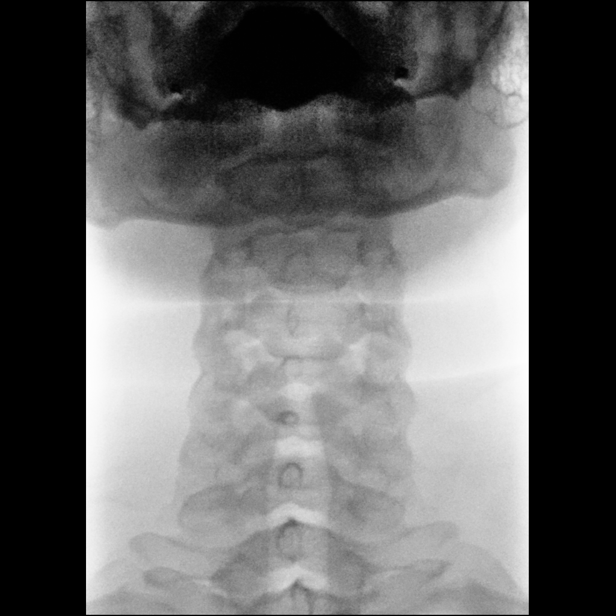
[frame 11/21]
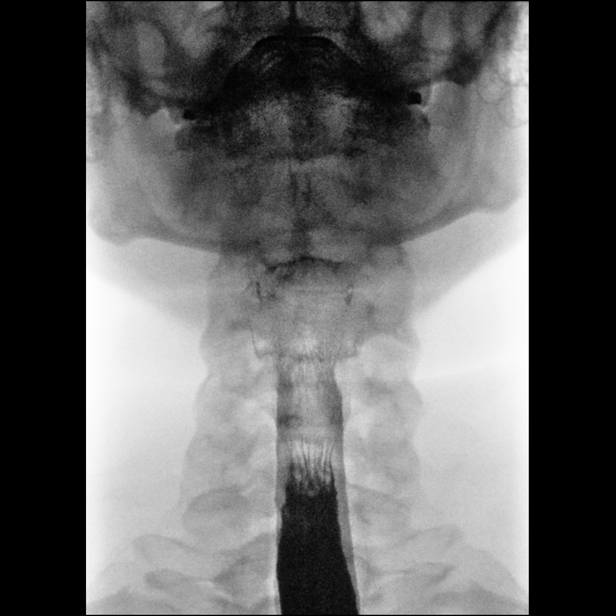

[Series 2: sequence · 2 of 15 frames shown (2 of 7)]
[frame 7/15]
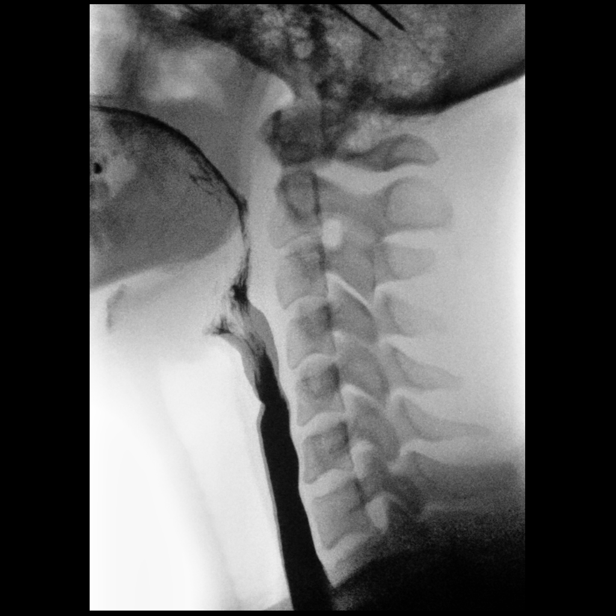
[frame 13/15]
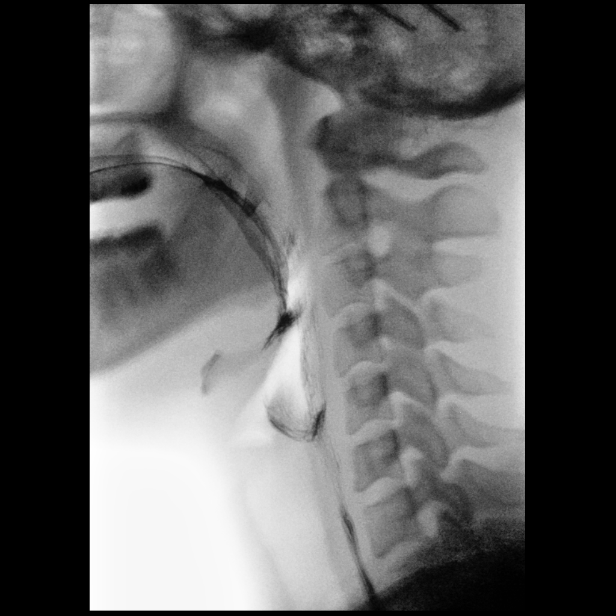

[Series 3: sequence · 2 of 63 frames shown (3 of 7)]
[frame 10/63]
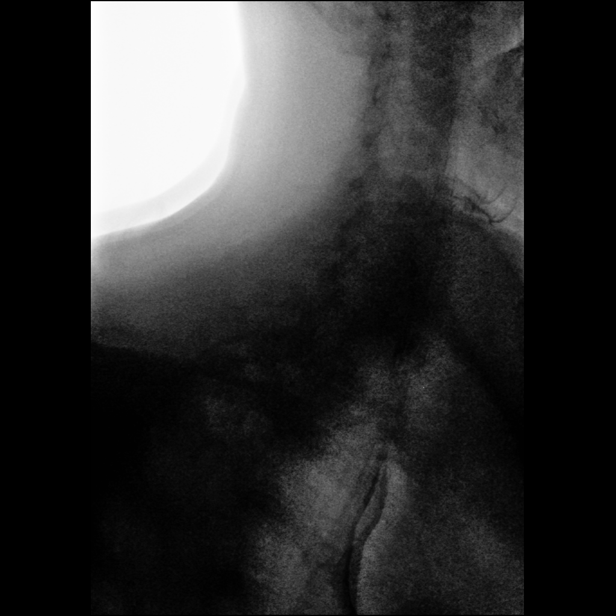
[frame 54/63]
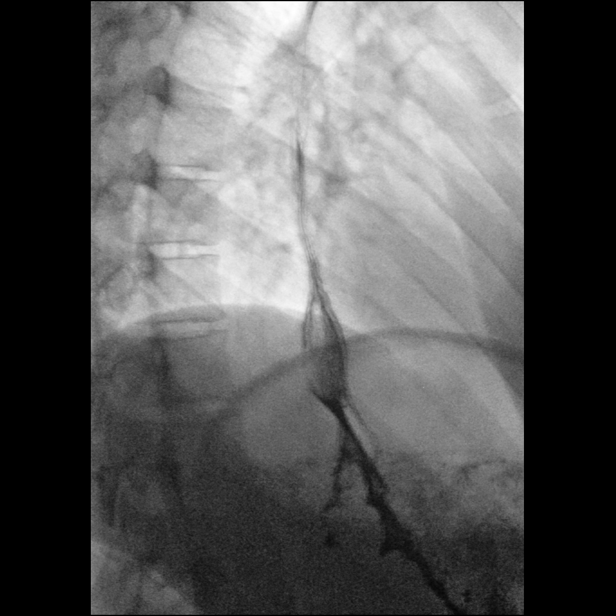

[Series 4: sequence · 2 of 37 frames shown (4 of 7)]
[frame 15/37]
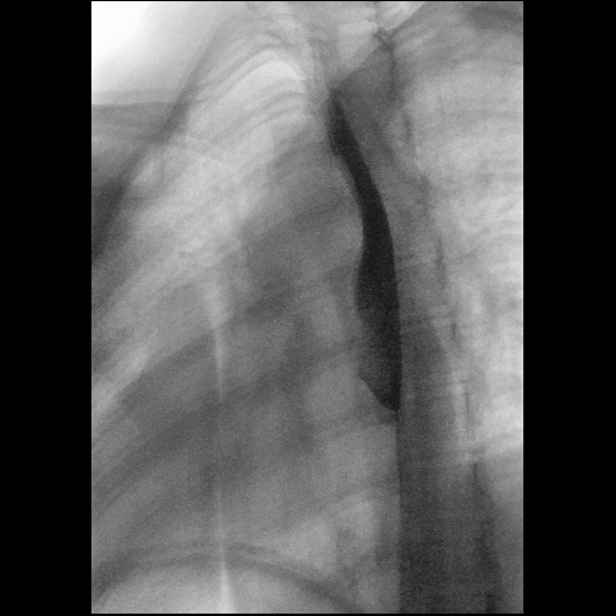
[frame 19/37]
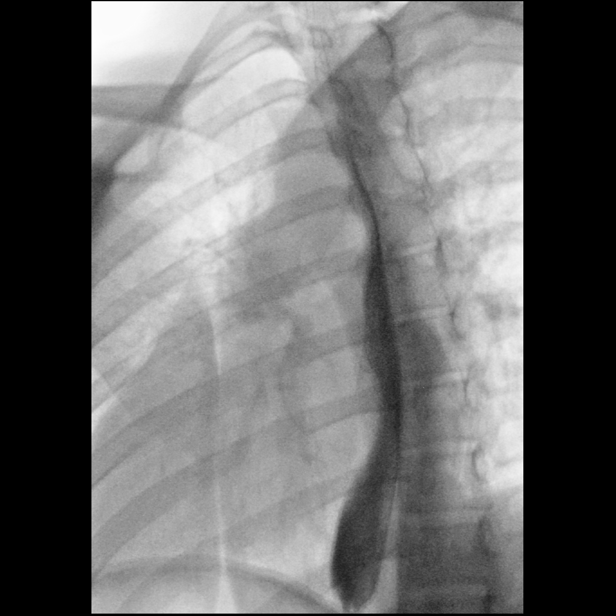

[Series 5: sequence · 2 of 45 frames shown (5 of 7)]
[frame 7/45]
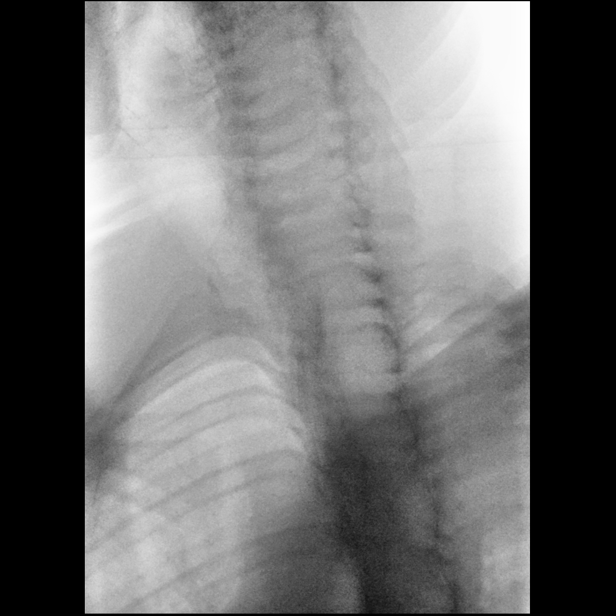
[frame 39/45]
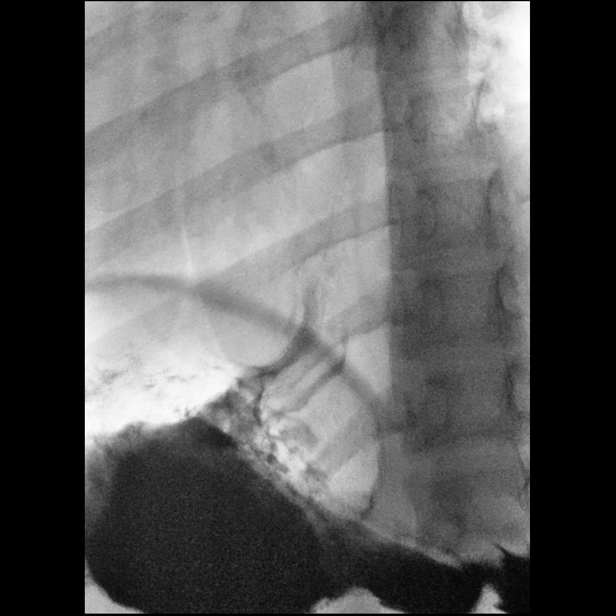

[Series 6: sequence · 2 of 34 frames shown (6 of 7)]
[frame 18/34]
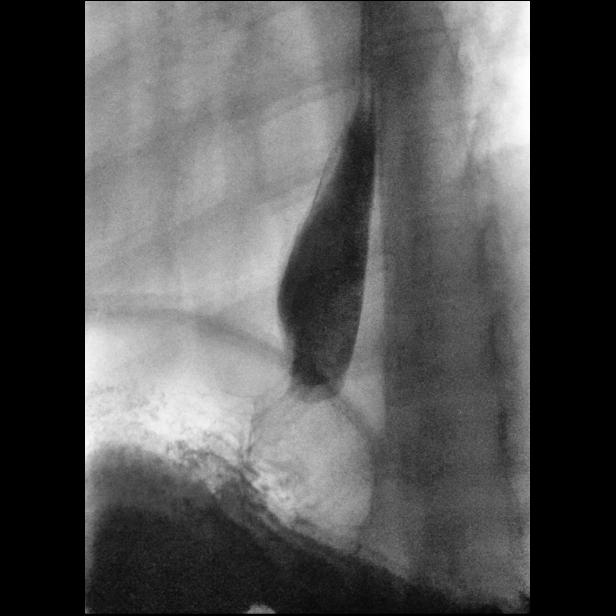
[frame 29/34]
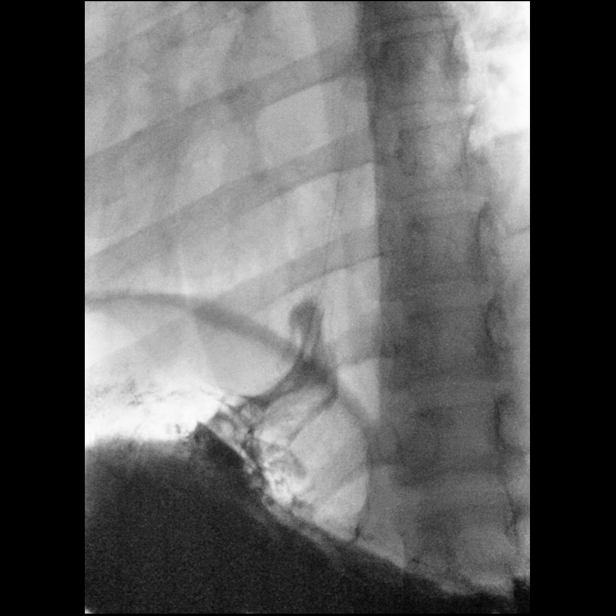

[Series 7: sequence · 1 of 39 frames shown (7 of 7)]
[frame 21/39]
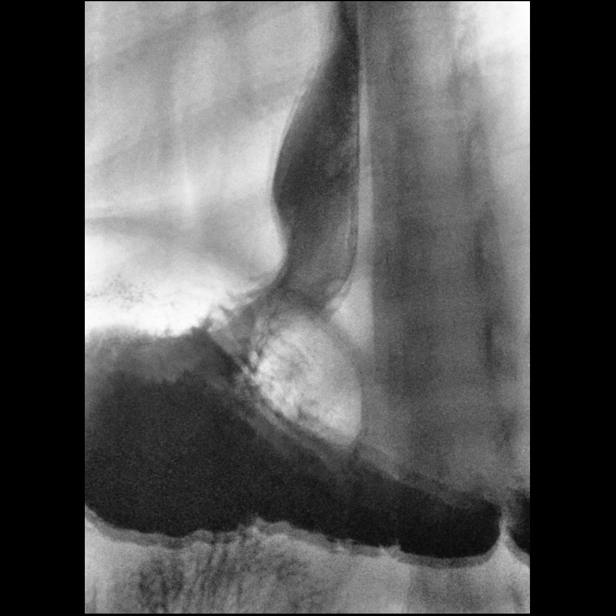

[Series 8: one shot · 1 of 1 slices shown]
[im 1/1]
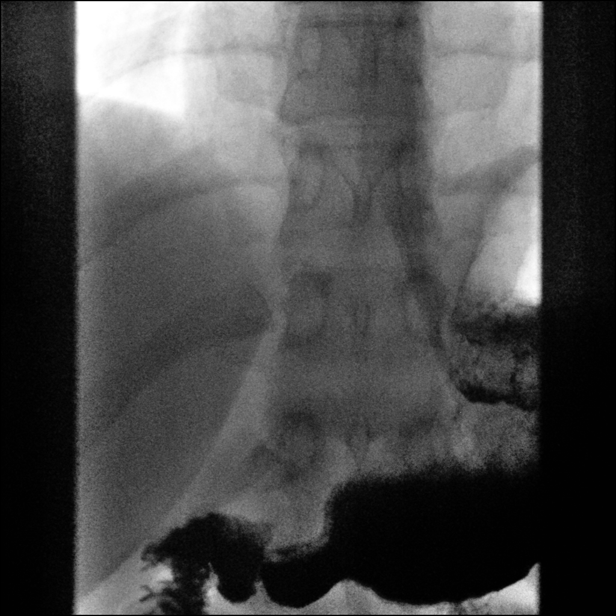

[14 of 24 positions shown; findings below may reference images not displayed]

FINDINGS: The pharyngeal phase of swallowing appears normal.

No mucosal abnormalities are visible in the esophagus on the double
contrast assessment. No significant distal esophageal fold
thickening is observed.

Primary peristaltic waves were preserved in the esophagus on [DATE] swallows. No esophageal stricturing or narrowing.

A 13 mm barium tablet passed promptly into the stomach, although the
patient did mention of swallowing the pill recreated her sensation
of dysphagia.
IMPRESSION: 1. No specific abnormality is identified to explain the patient's
globus sensation/dysphagia. The patient did note some discomfort
swallowing the barium tablet, but the tablet passed promptly into
the stomach.
